# Patient Record
Sex: Female | Born: 1995
Health system: Southern US, Community
[De-identification: ages and names within clinical notes are randomized; demographics above are authoritative.]

## PROBLEM LIST (undated history)

## (undated) DIAGNOSIS — K219 Gastro-esophageal reflux disease without esophagitis: Secondary | ICD-10-CM

## (undated) DIAGNOSIS — F32A Depression, unspecified: Secondary | ICD-10-CM

## (undated) DIAGNOSIS — E119 Type 2 diabetes mellitus without complications: Secondary | ICD-10-CM

## (undated) DIAGNOSIS — M1990 Unspecified osteoarthritis, unspecified site: Secondary | ICD-10-CM

## (undated) DIAGNOSIS — K7689 Other specified diseases of liver: Secondary | ICD-10-CM

## (undated) DIAGNOSIS — I10 Essential (primary) hypertension: Secondary | ICD-10-CM

## (undated) DIAGNOSIS — K2970 Gastritis, unspecified, without bleeding: Secondary | ICD-10-CM

## (undated) DIAGNOSIS — Q6689 Other  specified congenital deformities of feet: Secondary | ICD-10-CM

## (undated) DIAGNOSIS — F419 Anxiety disorder, unspecified: Secondary | ICD-10-CM

## (undated) DIAGNOSIS — E785 Hyperlipidemia, unspecified: Secondary | ICD-10-CM

## (undated) DIAGNOSIS — K648 Other hemorrhoids: Secondary | ICD-10-CM

## (undated) DIAGNOSIS — D649 Anemia, unspecified: Secondary | ICD-10-CM

## (undated) HISTORY — DX: Gastritis, unspecified, without bleeding: K29.70

## (undated) HISTORY — PX: KNEE SURGERY: SHX244

## (undated) HISTORY — DX: Unspecified osteoarthritis, unspecified site: M19.90

## (undated) HISTORY — DX: Anemia, unspecified: D64.9

## (undated) HISTORY — DX: Type 2 diabetes mellitus without complications: E11.9

## (undated) HISTORY — DX: Other hemorrhoids: K64.8

## (undated) HISTORY — DX: Hyperlipidemia, unspecified: E78.5

## (undated) HISTORY — DX: Essential (primary) hypertension: I10

## (undated) HISTORY — PX: UPPER GASTROINTESTINAL ENDOSCOPY: SHX188

## (undated) HISTORY — PX: COLONOSCOPY: SHX174

## (undated) HISTORY — DX: Anxiety disorder, unspecified: F41.9

## (undated) HISTORY — DX: Other specified congenital deformities of feet: Q66.89

## (undated) HISTORY — PX: APPENDECTOMY: SHX54

## (undated) HISTORY — DX: Depression, unspecified: F32.A

## (undated) HISTORY — DX: Gastro-esophageal reflux disease without esophagitis: K21.9

## (undated) HISTORY — DX: Other specified diseases of liver: K76.89

---
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
## 2014-05-04 ENCOUNTER — Emergency Department (HOSPITAL_COMMUNITY)
Admission: EM | Admit: 2014-05-04 | Discharge: 2014-05-04 | Disposition: A | Discharge status: Home / Self Care | Payer: Medicaid Other | Attending: Emergency Medicine | Admitting: Emergency Medicine

## 2014-05-04 DIAGNOSIS — Z8776 Personal history of (corrected) congenital malformations of integument, limbs and musculoskeletal system: Secondary | ICD-10-CM | POA: Diagnosis not present

## 2014-05-04 DIAGNOSIS — R339 Retention of urine, unspecified: Secondary | ICD-10-CM | POA: Insufficient documentation

## 2014-05-04 DIAGNOSIS — J45909 Unspecified asthma, uncomplicated: Secondary | ICD-10-CM | POA: Insufficient documentation

## 2014-05-04 DIAGNOSIS — Z8639 Personal history of other endocrine, nutritional and metabolic disease: Secondary | ICD-10-CM | POA: Insufficient documentation

## 2014-05-04 DIAGNOSIS — Z87768 Personal history of other specified (corrected) congenital malformations of integument, limbs and musculoskeletal system: Secondary | ICD-10-CM | POA: Insufficient documentation

## 2014-05-04 DIAGNOSIS — Z862 Personal history of diseases of the blood and blood-forming organs and certain disorders involving the immune mechanism: Secondary | ICD-10-CM | POA: Insufficient documentation

## 2014-05-04 DIAGNOSIS — Z79899 Other long term (current) drug therapy: Secondary | ICD-10-CM | POA: Diagnosis not present

## 2014-05-04 DIAGNOSIS — N390 Urinary tract infection, site not specified: Secondary | ICD-10-CM | POA: Insufficient documentation

## 2014-05-04 LAB — URINE MICROSCOPIC-ADD ON

## 2014-05-04 LAB — URINALYSIS, ROUTINE W REFLEX MICROSCOPIC
Bilirubin Urine: NEGATIVE
GLUCOSE, UA: NEGATIVE mg/dL
Ketones, ur: NEGATIVE mg/dL
Nitrite: NEGATIVE
PH: 7 (ref 5.0–8.0)
PROTEIN: NEGATIVE mg/dL
Specific Gravity, Urine: 1.024 (ref 1.005–1.030)
Urobilinogen, UA: 0.2 mg/dL (ref 0.0–1.0)

## 2014-05-04 MED ORDER — PHENAZOPYRIDINE HCL 200 MG PO TABS: 200.0000 mg | ORAL_TABLET | Freq: Three times a day (TID) | ORAL | Status: DC | PRN

## 2014-05-04 MED ORDER — CEPHALEXIN 500 MG PO CAPS: 500.0000 mg | ORAL_CAPSULE | Freq: Four times a day (QID) | ORAL | Status: DC

## 2014-05-04 NOTE — ED Notes (Signed)
Bed: [facility] Expected date:  Expected time:  Means of arrival:  Comments: 

## 2014-05-04 NOTE — ED Notes (Signed)
Did bladder scan on patient she has 86 ml in her bladder upon scan

## 2014-05-04 NOTE — ED Notes (Signed)
Pt reports dysuria x1 week. Pain 5/10. Pt reports hx of bladder problems and leakage. Pt did not take the medicine last week. Pt started having urinary retention this morning.

## 2014-05-04 NOTE — ED Notes (Signed)
Bed: [facility] Expected date:  Expected time:  Means of arrival:  Comments: 

## 2014-05-04 NOTE — Discharge Instructions (Signed)

## 2014-05-04 NOTE — ED Provider Notes (Signed)
CSN: [id number]     Arrival date & time 05/04/14  1303 History   First MD Initiated Contact with Patient 05/04/14 1427     Chief Complaint  Patient presents with  . Urinary Retention     (Consider location/radiation/quality/duration/timing/severity/associated sxs/prior Treatment) HPI   Meghan Riggs is a 18 y.o. female presents for evaluation dysuria and decreased urinary out put with urgency, for several days. She denies fever, chills, nausea, vomiting, weakness, or dizziness. She has chronic bladder disease, requiring medication, and follows closely with a urologist. She describes bladder instability. There are no other known modifying factors.  Past Medical History  Diagnosis Date  . Club foot 1996    Congenital  . Hyperlipidemia   . Asthma     Dr. Donneta Romberg   Past Surgical History  Procedure Laterality Date  . Club foot release  5260    @ 50 days old  . Knee surgery     Family History  Problem Relation Age of Onset  . Diabetes Father   . Diabetes Paternal Uncle   . Diabetes Paternal Grandmother    History  Substance Use Topics  . Smoking status: Never Smoker   . Smokeless tobacco: Not on file  . Alcohol Use: No   OB History   Grav Para Term Preterm Abortions TAB SAB Ect Mult Living                 Review of Systems  All other systems reviewed and are negative.     Allergies  Review of patient's allergies indicates no known allergies.  Home Medications   Prior to Admission medications   Medication Sig Start Date End Date Taking? Authorizing Provider  Albuterol Sulfate (VENTOLIN HFA IN) Inhale 2 puffs into the lungs every 6 (six) hours as needed.    Yes Historical Provider, MD  cetirizine (ZYRTEC) 10 MG tablet Take 10 mg by mouth daily.     Yes Historical Provider, MD  etonogestrel-ethinyl estradiol (NUVARING) 0.12-0.015 MG/24HR vaginal ring Place 1 each vaginally See admin instructions. Insert vaginally and leave in place for 24 days then puts a new  one in on the same day for 24 days and then takes out for four days.   Yes Historical Provider, MD  fesoterodine (TOVIAZ) 8 MG TB24 tablet Take 8 mg by mouth daily.   Yes Historical Provider, MD  omeprazole (PRILOSEC) 20 MG capsule Take 20 mg by mouth daily.   Yes Historical Provider, MD  cephALEXin (KEFLEX) 500 MG capsule Take 1 capsule (500 mg total) by mouth 4 (four) times daily. 05/04/14   Richarda Blade, MD  phenazopyridine (PYRIDIUM) 200 MG tablet Take 1 tablet (200 mg total) by mouth 3 (three) times daily as needed (dysuria). 05/04/14   Richarda Blade, MD   BP 122/72  Pulse 87  Temp(Src) 99 F (37.2 C) (Oral)  Resp 18  SpO2 100% Physical Exam  Nursing note and vitals reviewed. Constitutional: She is oriented to person, place, and time. She appears well-developed and well-nourished.  HENT:  Head: Normocephalic and atraumatic.  Right Ear: External ear normal.  Left Ear: External ear normal.  Eyes: Conjunctivae and EOM are normal. Pupils are equal, round, and reactive to light.  Neck: Normal range of motion and phonation normal. Neck supple.  Cardiovascular: Normal rate, regular rhythm, normal heart sounds and intact distal pulses.   Pulmonary/Chest: Effort normal and breath sounds normal. She exhibits no bony tenderness.  Abdominal: Soft. There is no tenderness.  Mild suprapubic  Genitourinary:  No costovertebral angle tenderness  Musculoskeletal: Normal range of motion.  Neurological: She is alert and oriented to person, place, and time. No cranial nerve deficit or sensory deficit. She exhibits normal muscle tone. Coordination normal.  Skin: Skin is warm, dry and intact.  Psychiatric: She has a normal mood and affect. Her behavior is normal. Judgment and thought content normal.    ED Course  Procedures (including critical care time) Labs Review Labs Reviewed  URINALYSIS, ROUTINE W REFLEX MICROSCOPIC - Abnormal; Notable for the following:    APPearance CLOUDY (*)    Hgb  urine dipstick SMALL (*)    Leukocytes, UA LARGE (*)    All other components within normal limits  URINE MICROSCOPIC-ADD ON - Abnormal; Notable for the following:    Bacteria, UA FEW (*)    All other components within normal limits  URINE CULTURE    Imaging Review No results found.   EKG Interpretation None      MDM   Final diagnoses:  UTI (lower urinary tract infection)   Evaluation consistent with urinary tract infection, cystitis. No urinary retention. Doubt, sepsis, serious bacterial infection or metabolic instability. She takes an anticholinergic for urge incontinence. UC pending  Nursing Notes Reviewed/ Care Coordinated Applicable Imaging Reviewed Interpretation of Laboratory Data incorporated into ED treatment  The patient appears reasonably screened and/or stabilized for discharge and I doubt any other medical condition or other [facility] requiring further screening, evaluation, or treatment in the ED at this time prior to discharge.  Plan: Home Medications- Keflex, Pyridium; Home Treatments- rest, fluids; return here if the recommended treatment, does not improve the symptoms; Recommended follow up- Urology 1 week   Richarda Blade, MD 05/05/14 1220

## 2014-05-06 LAB — URINE CULTURE: Colony Count: >=100000

## 2014-05-09 ENCOUNTER — Telehealth (HOSPITAL_BASED_OUTPATIENT_CLINIC_OR_DEPARTMENT_OTHER): Payer: Self-pay | Admitting: Emergency Medicine

## 2014-05-09 NOTE — Telephone Encounter (Signed)
Post ED Visit - Positive Culture Follow-up  Culture report reviewed by antimicrobial stewardship pharmacist: []  Wes Isabella, Pharm.D., BCPS [x]  Heide Guile, Pharm.D., BCPS []  Alycia Rossetti, Pharm.D., BCPS []  [place], Pharm.D., BCPS, AAHIVP []  Legrand Como, Pharm.D., BCPS, AAHIVP  Positive urine culture Treated with Keflex, organism sensitive to the same and no further patient follow-up is required at this time.  Hustler, Rex Kras 05/09/2014, 11:08 AM

## 2014-05-28 ENCOUNTER — Emergency Department (HOSPITAL_COMMUNITY)
Admission: EM | Admit: 2014-05-28 | Discharge: 2014-05-29 | Disposition: A | Discharge status: Home / Self Care | Payer: Medicaid Other | Attending: Emergency Medicine | Admitting: Emergency Medicine

## 2014-05-28 ENCOUNTER — Encounter (HOSPITAL_COMMUNITY): Payer: Self-pay | Admitting: Emergency Medicine

## 2014-05-28 DIAGNOSIS — N760 Acute vaginitis: Secondary | ICD-10-CM | POA: Diagnosis not present

## 2014-05-28 DIAGNOSIS — M549 Dorsalgia, unspecified: Secondary | ICD-10-CM | POA: Insufficient documentation

## 2014-05-28 DIAGNOSIS — Z862 Personal history of diseases of the blood and blood-forming organs and certain disorders involving the immune mechanism: Secondary | ICD-10-CM | POA: Diagnosis not present

## 2014-05-28 DIAGNOSIS — B9689 Other specified bacterial agents as the cause of diseases classified elsewhere: Secondary | ICD-10-CM | POA: Diagnosis not present

## 2014-05-28 DIAGNOSIS — R1011 Right upper quadrant pain: Secondary | ICD-10-CM | POA: Diagnosis not present

## 2014-05-28 DIAGNOSIS — Z79899 Other long term (current) drug therapy: Secondary | ICD-10-CM | POA: Diagnosis not present

## 2014-05-28 DIAGNOSIS — R197 Diarrhea, unspecified: Secondary | ICD-10-CM | POA: Diagnosis not present

## 2014-05-28 DIAGNOSIS — R112 Nausea with vomiting, unspecified: Secondary | ICD-10-CM | POA: Diagnosis not present

## 2014-05-28 DIAGNOSIS — Q6600 Congenital talipes equinovarus, unspecified foot: Secondary | ICD-10-CM | POA: Diagnosis not present

## 2014-05-28 DIAGNOSIS — Z3202 Encounter for pregnancy test, result negative: Secondary | ICD-10-CM | POA: Diagnosis not present

## 2014-05-28 DIAGNOSIS — R1084 Generalized abdominal pain: Secondary | ICD-10-CM | POA: Diagnosis not present

## 2014-05-28 DIAGNOSIS — J45909 Unspecified asthma, uncomplicated: Secondary | ICD-10-CM | POA: Diagnosis not present

## 2014-05-28 DIAGNOSIS — R1012 Left upper quadrant pain: Secondary | ICD-10-CM | POA: Insufficient documentation

## 2014-05-28 DIAGNOSIS — Z8639 Personal history of other endocrine, nutritional and metabolic disease: Secondary | ICD-10-CM | POA: Insufficient documentation

## 2014-05-28 DIAGNOSIS — K921 Melena: Secondary | ICD-10-CM | POA: Insufficient documentation

## 2014-05-28 DIAGNOSIS — A499 Bacterial infection, unspecified: Secondary | ICD-10-CM | POA: Insufficient documentation

## 2014-05-28 DIAGNOSIS — N390 Urinary tract infection, site not specified: Secondary | ICD-10-CM | POA: Diagnosis not present

## 2014-05-28 LAB — COMPREHENSIVE METABOLIC PANEL
ALBUMIN: 3.8 g/dL (ref 3.5–5.2)
ALK PHOS: 74 U/L (ref 39–117)
ALT: 30 U/L (ref 0–35)
AST: 31 U/L (ref 0–37)
Anion gap: 17 — ABNORMAL HIGH (ref 5–15)
BILIRUBIN TOTAL: 0.4 mg/dL (ref 0.3–1.2)
BUN: 7 mg/dL (ref 6–23)
CHLORIDE: 102 meq/L (ref 96–112)
CO2: 19 mEq/L (ref 19–32)
Calcium: 9.4 mg/dL (ref 8.4–10.5)
Creatinine, Ser: 0.56 mg/dL (ref 0.50–1.10)
GFR calc Af Amer: >90 mL/min (ref >90)
GFR calc non Af Amer: >90 mL/min (ref >90)
Glucose, Bld: 88 mg/dL (ref 70–99)
POTASSIUM: 3.8 meq/L (ref 3.7–5.3)
SODIUM: 138 meq/L (ref 137–147)
Total Protein: 7.4 g/dL (ref 6.0–8.3)

## 2014-05-28 LAB — CBC WITH DIFFERENTIAL/PLATELET
BASOS ABS: 0 10*3/uL (ref 0.0–0.1)
BASOS PCT: 0 % (ref 0–1)
Eosinophils Absolute: 0 10*3/uL (ref 0.0–0.7)
Eosinophils Relative: 0 % (ref 0–5)
HCT: 39.3 % (ref 36.0–46.0)
HEMOGLOBIN: 13.8 g/dL (ref 12.0–15.0)
Lymphocytes Relative: 19 % (ref 12–46)
Lymphs Abs: 1.3 10*3/uL (ref 0.7–4.0)
MCH: 29.6 pg (ref 26.0–34.0)
MCHC: 35.1 g/dL (ref 30.0–36.0)
MCV: 84.2 fL (ref 78.0–100.0)
Monocytes Absolute: 0.3 10*3/uL (ref 0.1–1.0)
Monocytes Relative: 5 % (ref 3–12)
NEUTROS ABS: 5.1 10*3/uL (ref 1.7–7.7)
NEUTROS PCT: 76 % (ref 43–77)
PLATELETS: 247 10*3/uL (ref 150–400)
RBC: 4.67 MIL/uL (ref 3.87–5.11)
RDW: 12.2 % (ref 11.5–15.5)
WBC: 6.7 10*3/uL (ref 4.0–10.5)

## 2014-05-28 LAB — LIPASE, BLOOD: Lipase: 18 U/L (ref 11–59)

## 2014-05-28 MED ORDER — ONDANSETRON HCL 4 MG PO TABS
8.0000 mg | ORAL_TABLET | Freq: Once | ORAL | Status: AC
  Administered 2014-05-28: 8 mg via ORAL
  Filled 2014-05-28: qty 2

## 2014-05-28 NOTE — ED Notes (Signed)
Upper abd. Pain., diarrhea since yesterday.

## 2014-05-28 NOTE — ED Notes (Signed)
Patient states unable to provide urine sample at this time.  Will continue to monitor.

## 2014-05-29 ENCOUNTER — Encounter (HOSPITAL_COMMUNITY): Payer: Self-pay | Admitting: *Deleted

## 2014-05-29 ENCOUNTER — Emergency Department (HOSPITAL_COMMUNITY): Payer: Medicaid Other

## 2014-05-29 LAB — URINE MICROSCOPIC-ADD ON

## 2014-05-29 LAB — URINALYSIS, ROUTINE W REFLEX MICROSCOPIC
GLUCOSE, UA: NEGATIVE mg/dL
HGB URINE DIPSTICK: NEGATIVE
Ketones, ur: 15 mg/dL — AB
Nitrite: NEGATIVE
Protein, ur: 30 mg/dL — AB
SPECIFIC GRAVITY, URINE: 1.036 — AB (ref 1.005–1.030)
UROBILINOGEN UA: 0.2 mg/dL (ref 0.0–1.0)
pH: 6 (ref 5.0–8.0)

## 2014-05-29 LAB — WET PREP, GENITAL
Trich, Wet Prep: NONE SEEN
YEAST WET PREP: NONE SEEN

## 2014-05-29 LAB — POC URINE PREG, ED: Preg Test, Ur: NEGATIVE

## 2014-05-29 MED ORDER — PROMETHAZINE HCL 25 MG/ML IJ SOLN
25.0000 mg | Freq: Once | INTRAMUSCULAR | Status: AC
  Administered 2014-05-29: 25 mg via INTRAVENOUS
  Filled 2014-05-29: qty 1

## 2014-05-29 MED ORDER — CEFTRIAXONE SODIUM 250 MG IJ SOLR
250.0000 mg | Freq: Once | INTRAMUSCULAR | Status: AC
  Administered 2014-05-29: 250 mg via INTRAMUSCULAR
  Filled 2014-05-29: qty 250

## 2014-05-29 MED ORDER — IOHEXOL 300 MG/ML  SOLN
25.0000 mL | Freq: Once | INTRAMUSCULAR | Status: AC | PRN
  Administered 2014-05-29: 25 mL via ORAL

## 2014-05-29 MED ORDER — AZITHROMYCIN 250 MG PO TABS
1000.0000 mg | ORAL_TABLET | Freq: Once | ORAL | Status: AC
  Administered 2014-05-29: 1000 mg via ORAL
  Filled 2014-05-29: qty 4

## 2014-05-29 MED ORDER — OXYCODONE-ACETAMINOPHEN 5-325 MG PO TABS: 1.0000 | ORAL_TABLET | ORAL | Status: DC | PRN

## 2014-05-29 MED ORDER — METRONIDAZOLE 500 MG PO TABS: 500.0000 mg | ORAL_TABLET | Freq: Two times a day (BID) | ORAL | Status: DC

## 2014-05-29 MED ORDER — LIDOCAINE HCL (PF) 1 % IJ SOLN
0.9000 mL | Freq: Once | INTRAMUSCULAR | Status: AC
  Administered 2014-05-29: 0.9 mL

## 2014-05-29 MED ORDER — ONDANSETRON HCL 8 MG PO TABS: 8.0000 mg | ORAL_TABLET | Freq: Three times a day (TID) | ORAL | Status: DC | PRN

## 2014-05-29 MED ORDER — MORPHINE SULFATE 4 MG/ML IJ SOLN
4.0000 mg | Freq: Once | INTRAMUSCULAR | Status: AC
  Administered 2014-05-29: 4 mg via INTRAVENOUS
  Filled 2014-05-29: qty 1

## 2014-05-29 MED ORDER — IOHEXOL 300 MG/ML  SOLN
100.0000 mL | Freq: Once | INTRAMUSCULAR | Status: AC | PRN
  Administered 2014-05-29: 100 mL via INTRAVENOUS

## 2014-05-29 MED ORDER — SODIUM CHLORIDE 0.9 % IV BOLUS (SEPSIS)
1000.0000 mL | Freq: Once | INTRAVENOUS | Status: AC
Start: 2014-05-29 — End: 2014-05-29
  Administered 2014-05-29: 1000 mL via INTRAVENOUS

## 2014-05-29 MED ORDER — SULFAMETHOXAZOLE-TMP DS 800-160 MG PO TABS: 1.0000 | ORAL_TABLET | Freq: Two times a day (BID) | ORAL | Status: DC

## 2014-05-29 NOTE — ED Provider Notes (Signed)
  Face-to-face evaluation   History: She complains of abdominal pain, and diarrhea for 2 days.  Physical exam: Alert, cooperative. Abdomen with mild, right-sided tenderness  CT imaging reviewed, is consistent with enteritis. No evident abnormality of gallbladder or appendix.  I discussed the patient, and family members. All questions answered  Medical screening examination/treatment/procedure(s) were conducted as a shared visit with non-physician practitioner(s) and myself.  I personally evaluated the patient during the encounter  Richarda Blade, MD 05/29/14 [phone]

## 2014-05-29 NOTE — Discharge Instructions (Signed)
Abdominal Pain Many things can cause abdominal pain. Usually, abdominal pain is not caused by a disease and will improve without treatment. It can often be observed and treated at home. Your health care provider will do a physical exam and possibly order blood tests and X-rays to help determine the seriousness of your pain. However, in many cases, more time must pass before a clear cause of the pain can be found. Before that point, your health care provider may not know if you need more testing or further treatment. HOME CARE INSTRUCTIONS  Monitor your abdominal pain for any changes. The following actions may help to alleviate any discomfort you are experiencing:  Only take over-the-counter or prescription medicines as directed by your health care provider.  Do not take laxatives unless directed to do so by your health care provider.  Try a clear liquid diet (broth, tea, or water) as directed by your health care provider. Slowly move to a bland diet as tolerated. SEEK MEDICAL CARE IF:  You have unexplained abdominal pain.  You have abdominal pain associated with nausea or diarrhea.  You have pain when you urinate or have a bowel movement.  You experience abdominal pain that wakes you in the night.  You have abdominal pain that is worsened or improved by eating food.  You have abdominal pain that is worsened with eating fatty foods.  You have a fever. SEEK IMMEDIATE MEDICAL CARE IF:   Your pain does not go away within 2 hours.  You keep throwing up (vomiting).  Your pain is felt only in portions of the abdomen, such as the right side or the left lower portion of the abdomen.  You pass bloody or black tarry stools. MAKE SURE YOU:  Understand these instructions.   Will watch your condition.   Will get help right away if you are not doing well or get worse.  Document Released: 07/06/2005 Document Revised: 10/01/2013 Document Reviewed: 06/05/2013 [facility] Patient Information  2015 [place], [place]. This information is not intended to replace advice given to you by your health care provider. Make sure you discuss any questions you have with your health care provider.  Bacterial Vaginosis Bacterial vaginosis is a vaginal infection that occurs when the normal balance of bacteria in the vagina is disrupted. It results from an overgrowth of certain bacteria. This is the most common vaginal infection in women of childbearing age. Treatment is important to prevent complications, especially in pregnant women, as it can cause a premature delivery. CAUSES  Bacterial vaginosis is caused by an increase in harmful bacteria that are normally present in smaller amounts in the vagina. Several different kinds of bacteria can cause bacterial vaginosis. However, the reason that the condition develops is not fully understood. RISK FACTORS Certain activities or behaviors can put you at an increased risk of developing bacterial vaginosis, including:  Having a new sex partner or multiple sex partners.  Douching.  Using an intrauterine device (IUD) for contraception. Women do not get bacterial vaginosis from toilet seats, bedding, swimming pools, or contact with objects around them. SIGNS AND SYMPTOMS  Some women with bacterial vaginosis have no signs or symptoms. Common symptoms include:  Grey vaginal discharge.  A fishlike odor with discharge, especially after sexual intercourse.  Itching or burning of the vagina and vulva.  Burning or pain with urination. DIAGNOSIS  Your health care provider will take a medical history and examine the vagina for signs of bacterial vaginosis. A sample of vaginal fluid may be  taken. Your health care provider will look at this sample under a microscope to check for bacteria and abnormal cells. A vaginal pH test may also be done.  TREATMENT  Bacterial vaginosis may be treated with antibiotic medicines. These may be given in the form of a pill or a  vaginal cream. A second round of antibiotics may be prescribed if the condition comes back after treatment.  HOME CARE INSTRUCTIONS   Only take over-the-counter or prescription medicines as directed by your health care provider.  If antibiotic medicine was prescribed, take it as directed. Make sure you finish it even if you start to feel better.  Do not have sex until treatment is completed.  Tell all sexual partners that you have a vaginal infection. They should see their health care provider and be treated if they have problems, such as a mild rash or itching.  Practice safe sex by using condoms and only having one sex partner. SEEK MEDICAL CARE IF:   Your symptoms are not improving after 3 days of treatment.  You have increased discharge or pain.  You have a fever. MAKE SURE YOU:   Understand these instructions.  Will watch your condition.  Will get help right away if you are not doing well or get worse. FOR MORE INFORMATION  Centers for Disease Control and Prevention, Division of STD Prevention: [employer] American Sexual Health Association (ASHA): www.ashastd.org  Document Released: 09/26/2005 Document Revised: 07/17/2013 Document Reviewed: 05/08/2013 [facility] Patient Information 2015 [place], [place]. This information is not intended to replace advice given to you by your health care provider. Make sure you discuss any questions you have with your health care provider.  Urinary Tract Infection Urinary tract infections (UTIs) can develop anywhere along your urinary tract. Your urinary tract is your body's drainage system for removing wastes and extra water. Your urinary tract includes two kidneys, two ureters, a bladder, and a urethra. Your kidneys are a pair of bean-shaped organs. Each kidney is about the size of your fist. They are located below your ribs, one on each side of your spine. CAUSES Infections are caused by microbes, which are microscopic organisms, including  fungi, viruses, and bacteria. These organisms are so small that they can only be seen through a microscope. Bacteria are the microbes that most commonly cause UTIs. SYMPTOMS  Symptoms of UTIs may vary by age and gender of the patient and by the location of the infection. Symptoms in young women typically include a frequent and intense urge to urinate and a painful, burning feeling in the bladder or urethra during urination. Older women and men are more likely to be tired, shaky, and weak and have muscle aches and abdominal pain. A fever may mean the infection is in your kidneys. Other symptoms of a kidney infection include pain in your back or sides below the ribs, nausea, and vomiting. DIAGNOSIS To diagnose a UTI, your caregiver will ask you about your symptoms. Your caregiver also will ask to provide a urine sample. The urine sample will be tested for bacteria and white blood cells. White blood cells are made by your body to help fight infection. TREATMENT  Typically, UTIs can be treated with medication. Because most UTIs are caused by a bacterial infection, they usually can be treated with the use of antibiotics. The choice of antibiotic and length of treatment depend on your symptoms and the type of bacteria causing your infection. HOME CARE INSTRUCTIONS  If you were prescribed antibiotics, take them exactly as  your caregiver instructs you. Finish the medication even if you feel better after you have only taken some of the medication.  Drink enough water and fluids to keep your urine clear or pale yellow.  Avoid caffeine, tea, and carbonated beverages. They tend to irritate your bladder.  Empty your bladder often. Avoid holding urine for long periods of time.  Empty your bladder before and after sexual intercourse.  After a bowel movement, women should cleanse from front to back. Use each tissue only once. SEEK MEDICAL CARE IF:   You have back pain.  You develop a fever.  Your symptoms  do not begin to resolve within 3 days. SEEK IMMEDIATE MEDICAL CARE IF:   You have severe back pain or lower abdominal pain.  You develop chills.  You have nausea or vomiting.  You have continued burning or discomfort with urination. MAKE SURE YOU:   Understand these instructions.  Will watch your condition.  Will get help right away if you are not doing well or get worse. Document Released: 07/06/2005 Document Revised: 03/27/2012 Document Reviewed: 11/04/2011 [facility] Patient Information 2015 [place], [place]. This information is not intended to replace advice given to you by your health care provider. Make sure you discuss any questions you have with your health care provider.

## 2014-05-29 NOTE — ED Provider Notes (Signed)
CSN: [id number]     Arrival date & time 05/28/14  1920 History   First MD Initiated Contact with Patient 05/28/14 2338     Chief Complaint  Patient presents with  . Abdominal Pain  . Diarrhea     (Consider location/radiation/quality/duration/timing/severity/associated sxs/prior Treatment) HPI Comments: Meghan Riggs is a 18 y.o. Female with a PMHx of asthma and HLD, who presents to the ED today with complaints of 1 day of sudden onset stabbing upper abd pain across entire abdomen and down to umbilicus, 5/95, sharp, radiating into her lower back, constant, worse with drinking and having BMs, and unrelieved by pepto bismol. No known alleviating factors. Pt states that she ate spaghetti last night around 4pm, and around 9pm developed nausea and vomited her food, and then developed some upper abdominal pain. Associated symptoms include chills, diarrhea (nonbloody, nonbilious, loose and slightly watery, 2-3 episodes), black stool x1 episode after pepto bismol use, N/V which was nonbloody nonbilious and only stomach contents, approx 3 episodes, and decreased urination secondary to dehydration after not being able to tolerate PO intake all day today. Denies CP, SOB, belching, obstipation, constipation, cough, dysuria, hematuria, vaginal discharge, vaginal bleeding, hematochezia, hematemesis, flank pain, pelvic pain, recent sexual activity, myalgias or arthralgias. Denies NSAID use or alcohol use. No sick contacts or suspicious food intake. No recent travel. LMP 05/22/14. Has nuvaring for protection, is not currently sexually active but has had intercourse in the past. No recent antibiotic use this month.  Patient is a 18 y.o. female presenting with abdominal pain. The history is provided by the patient. No language interpreter was used.  Abdominal Pain Pain location:  LUQ and RUQ Pain quality: stabbing   Pain radiates to:  Back (lower back) Pain severity:  Moderate (9/10) Onset quality:   Sudden Duration:  1 day Timing:  Constant Progression:  Unchanged Chronicity:  New Context: eating (ate spaghetti approx 3 hrs prior to onset)   Context: not alcohol use, not diet changes, not previous surgeries, not recent illness, not recent sexual activity, not recent travel, not sick contacts and not suspicious food intake   Relieved by:  Nothing Worsened by:  Bowel movements and eating Ineffective treatments:  OTC medications (pepto bismol) Associated symptoms: chills, diarrhea, melena (one episode of black stool after pepto bismol the day previous), nausea and vomiting (nonbloody nonbilious, stomach contents, approx 3 episodes)   Associated symptoms: no anorexia, no belching, no chest pain, no constipation, no cough, no dysuria, no fever, no flatus, no hematemesis, no hematochezia, no hematuria, no shortness of breath, no sore throat, no vaginal bleeding and no vaginal discharge   Risk factors: obesity   Risk factors: no alcohol abuse, has not had multiple surgeries and not pregnant     Past Medical History  Diagnosis Date  . Club foot 1996    Congenital  . Hyperlipidemia   . Asthma     Dr. Donneta Romberg   Past Surgical History  Procedure Laterality Date  . Club foot release  3373    @ 60 days old  . Knee surgery     Family History  Problem Relation Age of Onset  . Diabetes Father   . Diabetes Paternal Uncle   . Diabetes Paternal Grandmother    History  Substance Use Topics  . Smoking status: Never Smoker   . Smokeless tobacco: Not on file  . Alcohol Use: No   OB History   Grav Para Term Preterm Abortions TAB SAB Ect Mult Living  Review of Systems  Constitutional: Positive for chills. Negative for fever.  HENT: Negative for congestion and sore throat.   Respiratory: Negative for cough and shortness of breath.   Cardiovascular: Negative for chest pain.  Gastrointestinal: Positive for nausea, vomiting (nonbloody nonbilious, stomach contents, approx 3  episodes), abdominal pain, diarrhea and melena (one episode of black stool after pepto bismol the day previous). Negative for constipation, blood in stool ("black stool" after pepto bismol which was not tarry), hematochezia, abdominal distention, anorexia, flatus and hematemesis.  Endocrine: Negative for polydipsia and polyuria.  Genitourinary: Positive for decreased urine volume. Negative for dysuria, urgency, frequency, hematuria, flank pain, vaginal bleeding, vaginal discharge, difficulty urinating, vaginal pain, menstrual problem and pelvic pain.  Musculoskeletal: Positive for back pain (lower). Negative for arthralgias, joint swelling, myalgias, neck pain and neck stiffness.  Skin: Negative for color change.  Neurological: Negative for dizziness, syncope, weakness, light-headedness, numbness and headaches.  Psychiatric/Behavioral: Negative for confusion.  10 Systems reviewed and are negative for acute change except as noted in the HPI.     Allergies  Review of patient's allergies indicates no known allergies.  Home Medications   Prior to Admission medications   Medication Sig Start Date End Date Taking? Authorizing Provider  albuterol (PROVENTIL HFA;VENTOLIN HFA) 108 (90 BASE) MCG/ACT inhaler Inhale 2 puffs into the lungs every 6 (six) hours as needed for wheezing or shortness of breath.   Yes Historical Provider, MD  cetirizine (ZYRTEC) 10 MG tablet Take 10 mg by mouth daily.     Yes Historical Provider, MD  etonogestrel-ethinyl estradiol (NUVARING) 0.12-0.015 MG/24HR vaginal ring Place 1 each vaginally See admin instructions. Insert vaginally and leave in place for 24 days then puts a new one in on the same day for 24 days and then takes out for four days.   Yes Historical Provider, MD  fesoterodine (TOVIAZ) 8 MG TB24 tablet Take 8 mg by mouth daily.   Yes Historical Provider, MD  omeprazole (PRILOSEC) 20 MG capsule Take 20 mg by mouth daily.   Yes Historical Provider, MD   phenazopyridine (PYRIDIUM) 200 MG tablet Take 200 mg by mouth 3 (three) times daily as needed for pain.   Yes Historical Provider, MD  metroNIDAZOLE (FLAGYL) 500 MG tablet Take 1 tablet (500 mg total) by mouth 2 (two) times daily. One po bid x 7 days 05/29/14   Patty Sermons Camprubi-Soms, PA-C  ondansetron (ZOFRAN) 8 MG tablet Take 1 tablet (8 mg total) by mouth every 8 (eight) hours as needed for nausea or vomiting. 05/29/14   Patty Sermons Camprubi-Soms, PA-C  sulfamethoxazole-trimethoprim (BACTRIM DS) 800-160 MG per tablet Take 1 tablet by mouth 2 (two) times daily. 05/29/14   Jaida Basurto Strupp Camprubi-Soms, PA-C   BP 125/77  Pulse 89  Temp(Src) 98.3 F (36.8 C)  Resp 18  Ht 5' (1.524 m)  Wt 198 lb (89.812 kg)  BMI 38.67 kg/m2  SpO2 99%  LMP 05/28/2014 Physical Exam  Nursing note and vitals reviewed. Constitutional: She is oriented to person, place, and time. Vital signs are normal. She appears well-developed and well-nourished.  Non-toxic appearance. No distress.  VSS, afebrile, nontoxic, NAD  HENT:  Head: Normocephalic and atraumatic.  Mouth/Throat: Oropharynx is clear and moist. Mucous membranes are dry.  Dry mucous membranes  Eyes: Conjunctivae and EOM are normal. Pupils are equal, round, and reactive to light. Right eye exhibits no discharge. Left eye exhibits no discharge.  Neck: Normal range of motion. Neck supple.  Cardiovascular: Normal rate, regular rhythm,  normal heart sounds and intact distal pulses.  Exam reveals no gallop and no friction rub.   No murmur heard. Pulmonary/Chest: Effort normal and breath sounds normal. No respiratory distress. She has no decreased breath sounds. She has no wheezes. She has no rhonchi. She has no rales.  Abdominal: Soft. Normal appearance and bowel sounds are normal. She exhibits no distension. There is tenderness in the right upper quadrant, right lower quadrant and periumbilical area. There is positive Murphy's sign. There is no  rigidity, no rebound, no guarding, no CVA tenderness and no tenderness at McBurney's point.  Obese abdomen, body habitus limits exam. +BS throughout, soft, nondistended, with TTP in RUQ, periumbilical, and RLQ. Most intense tenderness in RUQ. +Murphy's sign, neg mcburney's point TTP, neg psoas sign with +Rovsing's sign. No peritoneal signs. No CVA TTP.   Genitourinary: Uterus normal. Pelvic exam was performed with patient supine. There is no rash, tenderness or lesion on the right labia. There is no rash, tenderness or lesion on the left labia. Cervix exhibits discharge and friability. Cervix exhibits no motion tenderness. Right adnexum displays no mass, no tenderness and no fullness. Left adnexum displays no mass, no tenderness and no fullness. No erythema, tenderness or bleeding around the vagina. No signs of injury around the vagina. Vaginal discharge found.  No rashes, lesions, or tenderness to external genitalia. No erythema, injury, or tenderness to vaginal mucosa. +mucoid vaginal discharge in vaginal vault. No bleeding within vaginal vault. No adnexal masses, tenderness, or fullness. No CMT. Cervix with strawberry appearance and punctate erythema, friability, and mucoid discharge from cervical os. Nuvaring in place. Uterus non-deviated, mobile, nonTTP, and without enlargement.    Musculoskeletal: Normal range of motion.  Neurological: She is alert and oriented to person, place, and time.  Skin: Skin is warm, dry and intact. No rash noted.  Psychiatric: She has a normal mood and affect.    ED Course  Procedures (including critical care time) Labs Review Labs Reviewed  WET PREP, GENITAL - Abnormal; Notable for the following:    Clue Cells Wet Prep HPF POC FEW (*)    WBC, Wet Prep HPF POC FEW (*)    All other components within normal limits  COMPREHENSIVE METABOLIC PANEL - Abnormal; Notable for the following:    Anion gap 17 (*)    All other components within normal limits  URINALYSIS,  ROUTINE W REFLEX MICROSCOPIC - Abnormal; Notable for the following:    Color, Urine AMBER (*)    APPearance CLOUDY (*)    Specific Gravity, Urine 1.036 (*)    Bilirubin Urine SMALL (*)    Ketones, ur 15 (*)    Protein, ur 30 (*)    Leukocytes, UA MODERATE (*)    All other components within normal limits  URINE MICROSCOPIC-ADD ON - Abnormal; Notable for the following:    Squamous Epithelial / LPF FEW (*)    Bacteria, UA MANY (*)    All other components within normal limits  GC/CHLAMYDIA PROBE AMP  CBC WITH DIFFERENTIAL  LIPASE, BLOOD  POC URINE PREG, ED    Imaging Review No results found.   EKG Interpretation None      MDM   Final diagnoses:  BV (bacterial vaginosis)  UTI (lower urinary tract infection)    18y/o female with upper abd pain, +murphy's but also has +RLQ pain and +rovsing's. Will obtain basic labs, u/a, upreg, pelvic, GC/CT, wet prep, and obtain CT abd/pelvis to r/o GB disease and appy, as well as pelvic  infection/TOA/cysts. Will give morphine 4mg  and phenergan now, pt already got zofran 8mg  ODT and states it made her nauseated. Will start IVFs as well as pt appears mildly dehydrated. Will reassess after pelvic and CT.  1:25 AM Pelvic demonstrating mucoid discharge and cervical friability with strawberry appearance. Will tx for GC/CT empirically. Wet prep reveals clue cells, will tx for BV using metro. U/A reveals +leuks, 7-10WBC, many bacteria, therefore will tx for UTI with bactrim. Upreg neg. CBC w/diff unremarkable, therefore less concerning for acute appy but risk still exists. CMP WNL, which argues against acute cholecystitis. Will reassess after CT. Discussed case with Maylene Roes PA-C who will f/up with CT results and dispo patient accordingly. Rx's written for metronidazole, zofran tabs, and bactrim.   BP 119/74  Pulse 78  Temp(Src) 98.3 F (36.8 C)  Resp 18  Ht 5' (1.524 m)  Wt 198 lb (89.812 kg)  BMI 38.67 kg/m2  SpO2 99%  LMP  05/28/2014  Meds ordered this encounter  Medications  . ondansetron (ZOFRAN) tablet 8 mg    Sig:   . sodium chloride 0.9 % bolus 1,000 mL    Sig:   . promethazine (PHENERGAN) injection 25 mg    Sig:   . morphine 4 MG/ML injection 4 mg    Sig:   . iohexol (OMNIPAQUE) 300 MG/ML solution 25 mL    Sig:   . iohexol (OMNIPAQUE) 300 MG/ML solution 100 mL    Sig:   . azithromycin (ZITHROMAX) tablet 1,000 mg    Sig:   . cefTRIAXone (ROCEPHIN) injection 250 mg    Sig:     Order Specific Question:  Antibiotic Indication:    Answer:  STD  . metroNIDAZOLE (FLAGYL) 500 MG tablet    Sig: Take 1 tablet (500 mg total) by mouth 2 (two) times daily. One po bid x 7 days    Dispense:  14 tablet    Refill:  0    Order Specific Question:  Supervising Provider    Answer:  Noemi Chapel D [[id number]]  . sulfamethoxazole-trimethoprim (BACTRIM DS) 800-160 MG per tablet    Sig: Take 1 tablet by mouth 2 (two) times daily.    Dispense:  14 tablet    Refill:  0    Order Specific Question:  Supervising Provider    Answer:  Noemi Chapel D [[id number]]  . ondansetron (ZOFRAN) 8 MG tablet    Sig: Take 1 tablet (8 mg total) by mouth every 8 (eight) hours as needed for nausea or vomiting.    Dispense:  10 tablet    Refill:  0    Order Specific Question:  Supervising Provider    Answer:  Johnna Acosta [[id number]]       [place], PA-C 05/29/14 [phone]

## 2014-05-29 NOTE — ED Notes (Signed)
Patient transported to CT 

## 2014-05-29 NOTE — ED Notes (Signed)
NAD at this time.  

## 2014-05-29 NOTE — ED Notes (Signed)
PA and CT notified that pt is unable to tolerate oral contrast.

## 2014-05-29 NOTE — ED Notes (Signed)
MD at bedside. 

## 2014-05-30 LAB — GC/CHLAMYDIA PROBE AMP
CT Probe RNA: NEGATIVE
GC Probe RNA: NEGATIVE

## 2015-04-13 ENCOUNTER — Emergency Department (HOSPITAL_COMMUNITY)
Admission: EM | Admit: 2015-04-13 | Discharge: 2015-04-13 | Disposition: A | Discharge status: Home / Self Care | Payer: Medicaid Other | Attending: Emergency Medicine | Admitting: Emergency Medicine

## 2015-04-13 ENCOUNTER — Encounter (HOSPITAL_COMMUNITY): Payer: Self-pay | Admitting: Emergency Medicine

## 2015-04-13 DIAGNOSIS — L989 Disorder of the skin and subcutaneous tissue, unspecified: Secondary | ICD-10-CM | POA: Diagnosis present

## 2015-04-13 DIAGNOSIS — Z79899 Other long term (current) drug therapy: Secondary | ICD-10-CM | POA: Diagnosis not present

## 2015-04-13 DIAGNOSIS — Z8639 Personal history of other endocrine, nutritional and metabolic disease: Secondary | ICD-10-CM | POA: Diagnosis not present

## 2015-04-13 DIAGNOSIS — Z8776 Personal history of (corrected) congenital malformations of integument, limbs and musculoskeletal system: Secondary | ICD-10-CM | POA: Insufficient documentation

## 2015-04-13 DIAGNOSIS — L02211 Cutaneous abscess of abdominal wall: Secondary | ICD-10-CM | POA: Diagnosis not present

## 2015-04-13 DIAGNOSIS — J45909 Unspecified asthma, uncomplicated: Secondary | ICD-10-CM | POA: Diagnosis not present

## 2015-04-13 DIAGNOSIS — L02219 Cutaneous abscess of trunk, unspecified: Secondary | ICD-10-CM

## 2015-04-13 MED ORDER — SULFAMETHOXAZOLE-TRIMETHOPRIM 800-160 MG PO TABS
1.0000 | ORAL_TABLET | Freq: Once | ORAL | Status: AC
  Administered 2015-04-13: 1 via ORAL
  Filled 2015-04-13: qty 1

## 2015-04-13 MED ORDER — LIDOCAINE-EPINEPHRINE 1 %-1:100000 IJ SOLN
10.0000 mL | Freq: Once | INTRAMUSCULAR | Status: AC
  Administered 2015-04-13: 10 mL
  Filled 2015-04-13: qty 1

## 2015-04-13 MED ORDER — SULFAMETHOXAZOLE-TRIMETHOPRIM 800-160 MG PO TABS: 1.0000 | ORAL_TABLET | Freq: Once | ORAL | Status: DC

## 2015-04-13 MED ORDER — OXYCODONE-ACETAMINOPHEN 5-325 MG PO TABS
1.0000 | ORAL_TABLET | Freq: Once | ORAL | Status: AC
  Administered 2015-04-13: 1 via ORAL
  Filled 2015-04-13: qty 1

## 2015-04-13 NOTE — ED Notes (Signed)
Per pt, states bug bite on right flank since last Wednesday-has gotten bigger-popped it a couple of times-increased discomfort

## 2015-04-13 NOTE — Discharge Instructions (Signed)

## 2015-04-13 NOTE — ED Notes (Signed)
MD at bedside. EDP KOHUT PRESENT

## 2015-04-13 NOTE — ED Provider Notes (Signed)
CSN: [id number]     Arrival date & time 04/13/15  0904 History   First MD Initiated Contact with Patient 04/13/15 [phone]     Chief Complaint  Patient presents with  . Insect Bite     (Consider location/radiation/quality/duration/timing/severity/associated sxs/prior Treatment) HPI   19yF with painful lesions R flank. Onset Wednesday. Felt may be bug bite. Progressively worsening in terms of pain and size. Yesterday friend "mashed on it" and reports some purulent drainage. Symptoms have continued to worsen despite this. Subjective fever. Not diabetic. No n/v.   Past Medical History  Diagnosis Date  . Club foot 1996    Congenital  . Hyperlipidemia   . Asthma     Dr. Donneta Romberg   Past Surgical History  Procedure Laterality Date  . Club foot release  5068    @ 18 days old  . Knee surgery     Family History  Problem Relation Age of Onset  . Diabetes Father   . Diabetes Paternal Uncle   . Diabetes Paternal Grandmother    History  Substance Use Topics  . Smoking status: Never Smoker   . Smokeless tobacco: Not on file  . Alcohol Use: No   OB History    No data available     Review of Systems  All systems reviewed and negative, other than as noted in HPI.   Allergies  Review of patient's allergies indicates no known allergies.  Home Medications   Prior to Admission medications   Medication Sig Start Date End Date Taking? Authorizing Provider  albuterol (PROVENTIL HFA;VENTOLIN HFA) 108 (90 BASE) MCG/ACT inhaler Inhale 2 puffs into the lungs every 6 (six) hours as needed for wheezing or shortness of breath.    Historical Provider, MD  cetirizine (ZYRTEC) 10 MG tablet Take 10 mg by mouth daily.      Historical Provider, MD  etonogestrel-ethinyl estradiol (NUVARING) 0.12-0.015 MG/24HR vaginal ring Place 1 each vaginally See admin instructions. Insert vaginally and leave in place for 24 days then puts a new one in on the same day for 24 days and then takes out for four days.     Historical Provider, MD  fesoterodine (TOVIAZ) 8 MG TB24 tablet Take 8 mg by mouth daily.    Historical Provider, MD  metroNIDAZOLE (FLAGYL) 500 MG tablet Take 1 tablet (500 mg total) by mouth 2 (two) times daily. One po bid x 7 days 05/29/14   Mercedes Camprubi-Soms, PA-C  omeprazole (PRILOSEC) 20 MG capsule Take 20 mg by mouth daily.    Historical Provider, MD  ondansetron (ZOFRAN) 8 MG tablet Take 1 tablet (8 mg total) by mouth every 8 (eight) hours as needed for nausea or vomiting. 05/29/14   Mercedes Camprubi-Soms, PA-C  oxyCODONE-acetaminophen (PERCOCET) 5-325 MG per tablet Take 1 tablet by mouth every 4 (four) hours as needed for severe pain. 05/29/14   Daleen Bo, MD  phenazopyridine (PYRIDIUM) 200 MG tablet Take 200 mg by mouth 3 (three) times daily as needed for pain.    Historical Provider, MD  sulfamethoxazole-trimethoprim (BACTRIM DS) 800-160 MG per tablet Take 1 tablet by mouth 2 (two) times daily. 05/29/14   Mercedes Camprubi-Soms, PA-C   BP 112/80 mmHg  Pulse 70  Temp(Src) 98.3 F (36.8 C) (Oral)  Resp 18  Ht 5' (1.524 m)  Wt 187 lb (84.823 kg)  BMI 36.52 kg/m2  SpO2 100%  LMP 03/10/2015 Physical Exam  Constitutional: She appears well-developed and well-nourished. No distress.  HENT:  Head: Normocephalic and atraumatic.  Eyes: Conjunctivae are normal. Right eye exhibits no discharge. Left eye exhibits no discharge.  Neck: Neck supple.  Cardiovascular: Normal rate, regular rhythm and normal heart sounds.  Exam reveals no gallop and no friction rub.   No murmur heard. Pulmonary/Chest: Effort normal and breath sounds normal. No respiratory distress.  Abdominal: Soft. She exhibits no distension. There is no tenderness.  Musculoskeletal: She exhibits no edema or tenderness.  Neurological: She is alert.  Skin: Skin is warm and dry.     Abscess in pictured location. Some spontaneous purulent drainage. A few cm of surrounding cellulitis.   Psychiatric: She has a normal mood  and affect. Her behavior is normal. Thought content normal.  Nursing note and vitals reviewed.   ED Course  Procedures (including critical care time) INCISION AND DRAINAGE Performed by: Virgel Manifold Consent: Verbal consent obtained. Risks and benefits: risks, benefits and alternatives were discussed Type: abscess  Body area: R flank Anesthesia: local infiltration  Incision was made with a scalpel.  Local anesthetic: lidocaine 1% w epinephrine  Anesthetic total: 3 ml  Complexity: complex Blunt dissection to break up loculations  Drainage: purulent  Drainage amount: moderate  Packing material: none  Patient tolerance: Patient tolerated the procedure well with no immediate complications.     Labs Review Labs Reviewed - No data to display  Imaging Review No results found.   EKG Interpretation None      MDM   Final diagnoses:  Cutaneous abscess of trunk, unspecified site    19yf with abscess R flank. Mild surrounding cellulitis. I&D'd. Abx. Continued wound care and return precautions discussed.     Virgel Manifold, MD 04/13/15 1028

## 2015-10-26 ENCOUNTER — Encounter (HOSPITAL_COMMUNITY): Payer: Self-pay | Admitting: Emergency Medicine

## 2015-10-26 ENCOUNTER — Emergency Department (HOSPITAL_COMMUNITY): Payer: Medicaid Other

## 2015-10-26 ENCOUNTER — Emergency Department (HOSPITAL_COMMUNITY)
Admission: EM | Admit: 2015-10-26 | Discharge: 2015-10-26 | Disposition: A | Discharge status: Home / Self Care | Payer: Medicaid Other | Attending: Emergency Medicine | Admitting: Emergency Medicine

## 2015-10-26 DIAGNOSIS — M545 Low back pain, unspecified: Secondary | ICD-10-CM

## 2015-10-26 DIAGNOSIS — Q6689 Other  specified congenital deformities of feet: Secondary | ICD-10-CM | POA: Diagnosis not present

## 2015-10-26 DIAGNOSIS — Z3202 Encounter for pregnancy test, result negative: Secondary | ICD-10-CM | POA: Diagnosis not present

## 2015-10-26 DIAGNOSIS — J45909 Unspecified asthma, uncomplicated: Secondary | ICD-10-CM | POA: Insufficient documentation

## 2015-10-26 DIAGNOSIS — R110 Nausea: Secondary | ICD-10-CM | POA: Diagnosis not present

## 2015-10-26 DIAGNOSIS — R1011 Right upper quadrant pain: Secondary | ICD-10-CM | POA: Insufficient documentation

## 2015-10-26 DIAGNOSIS — Z8639 Personal history of other endocrine, nutritional and metabolic disease: Secondary | ICD-10-CM | POA: Insufficient documentation

## 2015-10-26 DIAGNOSIS — Z79899 Other long term (current) drug therapy: Secondary | ICD-10-CM | POA: Insufficient documentation

## 2015-10-26 LAB — CBC WITH DIFFERENTIAL/PLATELET
Basophils Absolute: 0 10*3/uL (ref 0.0–0.1)
Basophils Relative: 0 %
Eosinophils Absolute: 0.1 10*3/uL (ref 0.0–0.7)
Eosinophils Relative: 1 %
HCT: 37.3 % (ref 36.0–46.0)
Hemoglobin: 12.8 g/dL (ref 12.0–15.0)
Lymphocytes Relative: 35 %
Lymphs Abs: 3.3 10*3/uL (ref 0.7–4.0)
MCH: 29.6 pg (ref 26.0–34.0)
MCHC: 34.3 g/dL (ref 30.0–36.0)
MCV: 86.1 fL (ref 78.0–100.0)
Monocytes Absolute: 0.6 10*3/uL (ref 0.1–1.0)
Monocytes Relative: 6 %
Neutro Abs: 5.5 10*3/uL (ref 1.7–7.7)
Neutrophils Relative %: 58 %
Platelets: 317 10*3/uL (ref 150–400)
RBC: 4.33 MIL/uL (ref 3.87–5.11)
RDW: 11.9 % (ref 11.5–15.5)
WBC: 9.5 10*3/uL (ref 4.0–10.5)

## 2015-10-26 LAB — URINALYSIS, ROUTINE W REFLEX MICROSCOPIC
Bilirubin Urine: NEGATIVE
Glucose, UA: NEGATIVE mg/dL
Hgb urine dipstick: NEGATIVE
Ketones, ur: NEGATIVE mg/dL
Nitrite: NEGATIVE
Protein, ur: NEGATIVE mg/dL
Specific Gravity, Urine: 1.03 (ref 1.005–1.030)
pH: 6 (ref 5.0–8.0)

## 2015-10-26 LAB — COMPREHENSIVE METABOLIC PANEL
ALT: 33 U/L (ref 14–54)
AST: 31 U/L (ref 15–41)
Albumin: 3.9 g/dL (ref 3.5–5.0)
Alkaline Phosphatase: 57 U/L (ref 38–126)
Anion gap: 10 (ref 5–15)
BUN: 11 mg/dL (ref 6–20)
CO2: 21 mmol/L — ABNORMAL LOW (ref 22–32)
Calcium: 9.6 mg/dL (ref 8.9–10.3)
Chloride: 109 mmol/L (ref 101–111)
Creatinine, Ser: 0.74 mg/dL (ref 0.44–1.00)
GFR calc Af Amer: >60 mL/min (ref >60)
GFR calc non Af Amer: >60 mL/min (ref >60)
Glucose, Bld: 82 mg/dL (ref 65–99)
Potassium: 3.6 mmol/L (ref 3.5–5.1)
Sodium: 140 mmol/L (ref 135–145)
Total Bilirubin: 0.3 mg/dL (ref 0.3–1.2)
Total Protein: 7.6 g/dL (ref 6.5–8.1)

## 2015-10-26 LAB — URINE MICROSCOPIC-ADD ON

## 2015-10-26 LAB — POC URINE PREG, ED: Preg Test, Ur: NEGATIVE

## 2015-10-26 MED ORDER — CYCLOBENZAPRINE HCL 10 MG PO TABS: 10.0000 mg | ORAL_TABLET | Freq: Two times a day (BID) | ORAL | Status: DC | PRN

## 2015-10-26 NOTE — ED Notes (Signed)
Patient was alert, oriented and stable upon discharge. RN went over AVS and patient had no further questions.  

## 2015-10-26 NOTE — ED Notes (Signed)
Pt reports lower back pain worsening over past week; denies injury of GU symptoms.

## 2015-10-26 NOTE — ED Notes (Signed)
US at bedside

## 2015-10-26 NOTE — ED Provider Notes (Signed)
CSN: [id number]     Arrival date & time 10/26/15  1800 History  By signing my name below, I, Irene Pap, attest that this documentation has been prepared under the direction and in the presence of [employer], PA-C. Electronically Signed: Irene Pap, ED Scribe. 10/26/2015. 10:29 PM.   Chief Complaint  Patient presents with  . Back Pain   The history is provided by the patient. No language interpreter was used.  HPI Comments: Meghan Riggs is a 20 y.o. Female with hx of congenital club foot who presents to the Emergency Department complaining of gradually worsening middle and right lower back pain onset two weeks ago. Pt is unable to sleep through the night due to pain. Pt does a lot of standing and lifting at work. She states that she has been taking ibuprofen and acetaminophen to no relief. She reports associated nausea and darkened urine with RUQ abdominal pain upon palpation. Pt uses NuvaRing for [facility] and has recently had a negative pregnancy test. Pt denies hx of similar symptoms, injury or trauma to the area, fever, chills, dysuria, hematuria, bladder or bowel incontinence. Pt is on Toviaz.   Past Medical History  Diagnosis Date  . Club foot 1996    Congenital  . Hyperlipidemia   . Asthma     Dr. Donneta Romberg   Past Surgical History  Procedure Laterality Date  . Club foot release  793    @ 35 days old  . Knee surgery     Family History  Problem Relation Age of Onset  . Diabetes Father   . Diabetes Paternal Uncle   . Diabetes Paternal Grandmother    Social History  Substance Use Topics  . Smoking status: Never Smoker   . Smokeless tobacco: None  . Alcohol Use: No   OB History    No data available     Review of Systems  All other systems reviewed and are negative.  Allergies  Review of patient's allergies indicates no known allergies.  Home Medications   Prior to Admission medications   Medication Sig Start Date End Date Taking? Authorizing Provider   etonogestrel-ethinyl estradiol (NUVARING) 0.12-0.015 MG/24HR vaginal ring Place 1 each vaginally See admin instructions. Insert vaginally and leave in place for 24 days then puts a new one in on the same day for 24 days and then takes out for four days.   Yes Historical Provider, MD  fesoterodine (TOVIAZ) 8 MG TB24 tablet Take 8 mg by mouth daily.   Yes Historical Provider, MD  ibuprofen (ADVIL,MOTRIN) 200 MG tablet Take 600-800 mg by mouth daily as needed (For pain and inflammation.).    Yes Historical Provider, MD  omeprazole (PRILOSEC) 20 MG capsule Take 20 mg by mouth 2 (two) times daily as needed (For heartburn or acid reflux.).    Yes Historical Provider, MD  PRESCRIPTION MEDICATION Take 1 tablet by mouth 2 (two) times daily as needed (acid reflux).   Yes Historical Provider, MD  cyclobenzaprine (FLEXERIL) 10 MG tablet Take 1 tablet (10 mg total) by mouth 2 (two) times daily as needed for muscle spasms. 10/26/15   Dellis Filbert Leinani Lisbon, PA-C  ondansetron (ZOFRAN) 8 MG tablet Take 1 tablet (8 mg total) by mouth every 8 (eight) hours as needed for nausea or vomiting. Patient not taking: Reported on 04/13/2015 05/29/14   Mercedes Camprubi-Soms, PA-C  oxyCODONE-acetaminophen (PERCOCET) 5-325 MG per tablet Take 1 tablet by mouth every 4 (four) hours as needed for severe pain. Patient not taking: Reported  on 04/13/2015 05/29/14   Daleen Bo, MD  sulfamethoxazole-trimethoprim (BACTRIM DS) 800-160 MG per tablet Take 1 tablet by mouth 2 (two) times daily. Patient not taking: Reported on 04/13/2015 05/29/14   Mercedes Camprubi-Soms, PA-C   BP 129/83 mmHg  Pulse 85  Temp(Src) 98 F (36.7 C) (Oral)  Resp 18  SpO2 100%  LMP 10/13/2015 Physical Exam  Constitutional: She is oriented to person, place, and time. She appears well-developed and well-nourished. No distress.  HENT:  Head: Normocephalic.  Right Ear: Tympanic membrane, external ear and ear canal normal.  Left Ear: Tympanic membrane, external ear and ear  canal normal.  Neck: Normal range of motion. Neck supple.  Cardiovascular: Normal rate and regular rhythm.   Pulmonary/Chest: Effort normal and breath sounds normal.  Abdominal: Soft. There is tenderness in the right upper quadrant.  Mild RUQ TTP  Musculoskeletal: Normal range of motion. She exhibits tenderness. She exhibits no edema.  No C, T, or L spine tenderness to palpation. No obvious signs of trauma, deformity, infection, step-offs. Lung expansion normal. No scoliosis or kyphosis. Bilateral lower extremity strength 5 out of 5, sensation grossly intact, patellar reflexes 2+,Refill less than 3 seconds.  Straight leg negative Ambulates without difficulty   Neurological: She is alert and oriented to person, place, and time.  Skin: Skin is warm and dry. She is not diaphoretic.  Psychiatric: She has a normal mood and affect. Her behavior is normal. Judgment and thought content normal.  Nursing note and vitals reviewed.   ED Course  Procedures (including critical care time) DIAGNOSTIC STUDIES: Oxygen Saturation is 100% on RA, normal by my interpretation.    COORDINATION OF CARE: 9:11 PM-Discussed treatment plan which includes UA and [name] with pt at bedside and pt agreed to plan.   Labs Review Labs Reviewed  URINALYSIS, ROUTINE W REFLEX MICROSCOPIC (NOT AT [facility]) - Abnormal; Notable for the following:    APPearance CLOUDY (*)    Leukocytes, UA TRACE (*)    All other components within normal limits  COMPREHENSIVE METABOLIC PANEL - Abnormal; Notable for the following:    CO2 21 (*)    All other components within normal limits  URINE MICROSCOPIC-ADD ON - Abnormal; Notable for the following:    Squamous Epithelial / LPF 6-30 (*)    Bacteria, UA FEW (*)    All other components within normal limits  CBC WITH DIFFERENTIAL/PLATELET  POC URINE PREG, ED    Imaging Review US Abdomen Limited Ruq  10/26/2015  CLINICAL DATA:  Acute onset of right upper quadrant abdominal pain. Initial  encounter. EXAM: US ABDOMEN LIMITED - RIGHT UPPER QUADRANT COMPARISON:  CT of the abdomen and pelvis from 05/29/2014 FINDINGS: Gallbladder: No gallstones or wall thickening visualized. No sonographic Murphy sign noted by sonographer. Common bile duct: Diameter: 0.4 cm, within normal limits in caliber. Liver: No focal lesion identified. Mildly coarsened hepatic echotexture, nonspecific in appearance. IMPRESSION: No acute abnormality seen at the right upper quadrant. Electronically Signed   By: Garald Balding M.D.   On: 10/26/2015 21:56   I have personally reviewed and evaluated these images and lab results as part of my medical decision-making.   EKG Interpretation None      MDM   Final diagnoses:  RUQ pain  Right-sided low back pain without sciatica   Labs: Posterior break, CMP, CBC, urinalysis  Imaging: Right upper quadrant ultrasound  Consults:  Therapeutics:  Discharge Meds:   Assessment/Plan: Patient's presentation is most consistent with musculoskeletal back pain. She  has no red flags for back pain, no neurological deficits. Patient had some right upper quadrant tenderness, labs are reassuring, no signs on ultrasound indicating any significant pathology. Patient discharged home on Flexeril, symptomatic care instructions, primary care follow-up. Strict return precautions given, she verbalizes understanding and agreement today's plan and had no further questions or concerns at time of discharge.     Okey Regal, PA-C 10/28/15 0425  Forde Dandy, MD 10/28/15 2020

## 2015-10-26 NOTE — Discharge Instructions (Signed)
Please read attached information. If you experience any new or worsening signs or symptoms please return to the emergency room for evaluation. Please follow-up with your primary care provider or specialist as discussed. Please use medication prescribed only as directed and discontinue taking if you have any concerning signs or symptoms.   °

## 2015-11-02 ENCOUNTER — Ambulatory Visit: Payer: Medicaid Other | Admitting: Podiatry

## 2015-11-04 ENCOUNTER — Encounter: Payer: Self-pay | Admitting: Podiatry

## 2015-11-04 ENCOUNTER — Ambulatory Visit (INDEPENDENT_AMBULATORY_CARE_PROVIDER_SITE_OTHER): Payer: Medicaid Other

## 2015-11-04 ENCOUNTER — Ambulatory Visit (INDEPENDENT_AMBULATORY_CARE_PROVIDER_SITE_OTHER): Payer: Medicaid Other | Admitting: Podiatry

## 2015-11-04 VITALS — BP 95/73 | HR 84 | Resp 16

## 2015-11-04 DIAGNOSIS — M79671 Pain in right foot: Secondary | ICD-10-CM

## 2015-11-04 DIAGNOSIS — M79672 Pain in left foot: Principal | ICD-10-CM

## 2015-11-04 DIAGNOSIS — M7750 Other enthesopathy of unspecified foot: Secondary | ICD-10-CM | POA: Diagnosis not present

## 2015-11-04 DIAGNOSIS — M19079 Primary osteoarthritis, unspecified ankle and foot: Secondary | ICD-10-CM

## 2015-11-04 DIAGNOSIS — M779 Enthesopathy, unspecified: Secondary | ICD-10-CM

## 2015-11-04 MED ORDER — TRIAMCINOLONE ACETONIDE 10 MG/ML IJ SUSP
10.0000 mg | Freq: Once | INTRAMUSCULAR | Status: AC
  Administered 2015-11-04: 10 mg

## 2015-11-04 NOTE — Progress Notes (Signed)
   Subjective:    Patient ID: Meghan Riggs, female    DOB: August 07, 1996, 20 y.o.   MRN: [id number]  HPI Pt presents with bilateral foot pain, severe in nature with swelling of ankles. Pain occurs after prolonged walking and can last for a few days   Review of Systems  Musculoskeletal: Positive for gait problem.  All other systems reviewed and are negative.      Objective:   Physical Exam        Assessment & Plan:

## 2015-11-05 NOTE — Progress Notes (Signed)
Subjective:     Patient ID: Meghan Riggs, female   DOB: 12/09/1995, 20 y.o.   MRN: [id number]  HPI patient presents with ankle pain bilateral with history of having procedure for severe foot deformity when she was born and states that she tries to work but her ankles can swell   Review of Systems  All other systems reviewed and are negative.      Objective:   Physical Exam  Constitutional: She is oriented to person, place, and time.  Cardiovascular: Intact distal pulses.   Musculoskeletal: Normal range of motion.  Neurological: She is oriented to person, place, and time.  Skin: Skin is warm.  Nursing note and vitals reviewed.  neurovascular status found to be intact with muscle strength adequate range of motion within normal limits with patient found to have significant discomfort in the sinus tarsi bilateral with moderate depression of the arch range of motion is adequate but what appears to be some form of ankle instability bilateral with swelling.     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 She is going to go to wait for [name] for opinion in the orthopedic department and today capsular sinus tarsi injections administered 3 mg Kenalog 5 mill grams Xylocaine to try to give her temporary relief

## 2015-11-09 ENCOUNTER — Encounter: Payer: Self-pay | Admitting: Podiatry

## 2015-11-09 ENCOUNTER — Ambulatory Visit (INDEPENDENT_AMBULATORY_CARE_PROVIDER_SITE_OTHER): Payer: Medicaid Other | Admitting: Podiatry

## 2015-11-09 ENCOUNTER — Telehealth: Payer: Self-pay | Admitting: *Deleted

## 2015-11-09 VITALS — BP 127/80 | HR 77 | Resp 16

## 2015-11-09 DIAGNOSIS — M19079 Primary osteoarthritis, unspecified ankle and foot: Secondary | ICD-10-CM

## 2015-11-09 DIAGNOSIS — M779 Enthesopathy, unspecified: Secondary | ICD-10-CM

## 2015-11-09 DIAGNOSIS — M7750 Other enthesopathy of unspecified foot: Secondary | ICD-10-CM | POA: Diagnosis not present

## 2015-11-09 MED ORDER — PREDNISONE 10 MG PO TABS: ORAL_TABLET | ORAL | Status: DC

## 2015-11-09 MED ORDER — TRIAMCINOLONE ACETONIDE 10 MG/ML IJ SUSP
10.0000 mg | Freq: Once | INTRAMUSCULAR | Status: AC
  Administered 2015-11-09: 10 mg

## 2015-11-09 NOTE — Telephone Encounter (Signed)
Pt states she was here last week and received injections in her heels and her heels are killing her and she has to go to work, what should she do.  I had schedulers get her into the office today.

## 2015-11-10 NOTE — Progress Notes (Signed)
Subjective:     Patient ID: Meghan Riggs, female   DOB: 1996-06-19, 20 y.o.   MRN: [id number]  HPI patient states that her ankle still quite a bit better but then she in the last couple days it started to develop a lot of pain in the outside of her right foot with out specific injury   Review of Systems     Objective:   Physical Exam Neurovascular status was intact and I did check muscle strength found to be adequate with no indication of tendon disruption or muscle disruption. There is discomfort underneath the lateral malleolus right with the sinus tarsi still painful but moderately improved and quite a bit of improvement on the left. No indication of tendon Dysfunction currently    Assessment:     Inflammatory tendinitis right that may have been due to change in gait as her ankle felt better after sinus tarsi injection    Plan:     Careful sheath injection administered right and then went ahead and applied air fracture walker to immobilize and reduce stress on the tendon. Reappoint to recheck

## 2015-11-26 DIAGNOSIS — M19071 Primary osteoarthritis, right ankle and foot: Secondary | ICD-10-CM | POA: Insufficient documentation

## 2016-11-14 ENCOUNTER — Emergency Department (HOSPITAL_COMMUNITY): Payer: Self-pay

## 2016-11-14 ENCOUNTER — Emergency Department (HOSPITAL_COMMUNITY)
Admission: EM | Admit: 2016-11-14 | Discharge: 2016-11-14 | Disposition: A | Discharge status: Home / Self Care | Payer: Self-pay | Attending: Physician Assistant | Admitting: Physician Assistant

## 2016-11-14 ENCOUNTER — Encounter (HOSPITAL_COMMUNITY): Payer: Self-pay | Admitting: Emergency Medicine

## 2016-11-14 DIAGNOSIS — J069 Acute upper respiratory infection, unspecified: Secondary | ICD-10-CM | POA: Insufficient documentation

## 2016-11-14 DIAGNOSIS — J399 Disease of upper respiratory tract, unspecified: Secondary | ICD-10-CM

## 2016-11-14 DIAGNOSIS — J45909 Unspecified asthma, uncomplicated: Secondary | ICD-10-CM | POA: Insufficient documentation

## 2016-11-14 LAB — POC URINE PREG, ED: PREG TEST UR: NEGATIVE

## 2016-11-14 MED ORDER — IBUPROFEN 800 MG PO TABS: 800.0000 mg | ORAL_TABLET | Freq: Three times a day (TID) | ORAL | 0 refills | Status: DC

## 2016-11-14 MED ORDER — IBUPROFEN 400 MG PO TABS
600.0000 mg | ORAL_TABLET | Freq: Once | ORAL | Status: AC
  Administered 2016-11-14: 600 mg via ORAL
  Filled 2016-11-14: qty 1

## 2016-11-14 MED ORDER — ACETAMINOPHEN 500 MG PO TABS: 500.0000 mg | ORAL_TABLET | Freq: Four times a day (QID) | ORAL | 0 refills | Status: DC | PRN

## 2016-11-14 NOTE — ED Notes (Signed)
Patient Alert and oriented X4. Stable and ambulatory. Patient verbalized understanding of the discharge instructions.  Patient belongings were taken by the patient.  

## 2016-11-14 NOTE — ED Triage Notes (Signed)
Chills hot cold cough and chest congestion  Since yesterday

## 2016-11-14 NOTE — ED Provider Notes (Signed)
[facility] DEPT Provider Note   CSN: [id number] Arrival date & time: 11/14/16  1707  By signing my name below, I, Neta Mends, attest that this documentation has been prepared under the direction and in the presence of Melvinia Ashby Julio Alm, MD . Electronically Signed: Neta Mends, ED Scribe. 11/14/2016. 7:30 PM.    History   Chief Complaint Chief Complaint  Patient presents with  . Cough  . Diarrhea  . Influenza  . Chest Pain    The history is provided by the patient. No language interpreter was used.   HPI Comments:  Meghan Riggs is a 21 y.o. female who presents to the Emergency Department complaining of a worsening, persistent cough since yesterday. Pt states that when she coughs she has chest pain and a headache. Pt complains of associated rhinorrhea, diarrhea, sneezing, eye watering, diaphoresis, chills. Pt reports possible sick contact with her stepson. Pt has taken OTC cold and flu medication with no relief. Pt denies myalgias.   Past Medical History:  Diagnosis Date  . Asthma    Dr. Donneta Romberg  . Club foot 1996   Congenital  . Hyperlipidemia     There are no active problems to display for this patient.   Past Surgical History:  Procedure Laterality Date  . CLUB FOOT RELEASE  2232   @ 61 days old  . KNEE SURGERY      OB History    No data available       Home Medications    Prior to Admission medications   Medication Sig Start Date End Date Taking? Authorizing Provider  cyclobenzaprine (FLEXERIL) 10 MG tablet Take 1 tablet (10 mg total) by mouth 2 (two) times daily as needed for muscle spasms. 10/26/15   Okey Regal, PA-C  etonogestrel-ethinyl estradiol (NUVARING) 0.12-0.015 MG/24HR vaginal ring Place 1 each vaginally See admin instructions. Insert vaginally and leave in place for 24 days then puts a new one in on the same day for 24 days and then takes out for four days.    Historical Provider, MD  fesoterodine (TOVIAZ) 8 MG TB24  tablet Take 8 mg by mouth daily.    Historical Provider, MD  ibuprofen (ADVIL,MOTRIN) 200 MG tablet Take 600-800 mg by mouth daily as needed (For pain and inflammation.).     Historical Provider, MD  omeprazole (PRILOSEC) 20 MG capsule Take 20 mg by mouth 2 (two) times daily as needed (For heartburn or acid reflux.).     Historical Provider, MD  ondansetron (ZOFRAN) 8 MG tablet Take 1 tablet (8 mg total) by mouth every 8 (eight) hours as needed for nausea or vomiting. 05/29/14   Mercedes Street, PA-C  predniSONE (DELTASONE) 10 MG tablet 12 day tapering dose 11/09/15   Wallene Huh, DPM  PRESCRIPTION MEDICATION Take 1 tablet by mouth 2 (two) times daily as needed (acid reflux).    Historical Provider, MD    Family History Family History  Problem Relation Age of Onset  . Diabetes Father   . Diabetes Paternal Uncle   . Diabetes Paternal Grandmother     Social History Social History  Substance Use Topics  . Smoking status: Never Smoker  . Smokeless tobacco: Never Used  . Alcohol use No     Allergies   Patient has no known allergies.   Review of Systems Review of Systems  Constitutional: Positive for chills, diaphoresis and fatigue.  HENT: Positive for rhinorrhea and sneezing.   Respiratory: Positive for cough.   Cardiovascular:  Positive for chest pain.  Gastrointestinal: Positive for diarrhea.  Musculoskeletal: Negative for myalgias.  Neurological: Positive for headaches.  All other systems reviewed and are negative.    Physical Exam Updated Vital Signs BP 139/96   Pulse 86   Temp 99.4 F (37.4 C) (Oral)   Resp 16   SpO2 100%   Physical Exam  Constitutional: She appears well-developed and well-nourished. No distress.  HENT:  Head: Normocephalic and atraumatic.  Eyes: Conjunctivae are normal.  Cardiovascular: Normal rate.   Pulmonary/Chest: Effort normal.  Abdominal: She exhibits no distension.  Neurological: She is alert.  Skin: Skin is warm and dry.    Psychiatric: She has a normal mood and affect.  Nursing note and vitals reviewed.    ED Treatments / Results  DIAGNOSTIC STUDIES:  Oxygen Saturation is 100% on RA, normal by my interpretation.    COORDINATION OF CARE:  7:30 PM Pt instructed to alternate between tylenol and ibuprofen. Discussed treatment plan with pt at bedside and pt agreed to plan.   Labs (all labs ordered are listed, but only abnormal results are displayed) Labs Reviewed  POC URINE PREG, ED    EKG  EKG Interpretation  Date/Time:  Monday November 14 2016 17:22:17 EST Ventricular Rate:  80 PR Interval:  148 QRS Duration: 80 QT Interval:  354 QTC Calculation: 408 R Axis:   90 Text Interpretation:  Normal sinus rhythm with sinus arrhythmia Rightward axis Nonspecific T wave abnormality Abnormal ECG no ischemia noted Confirmed by Gerald Leitz ([id number]) on 11/14/2016 7:08:08 PM       Radiology Dg Chest 2 View  Result Date: 11/14/2016 CLINICAL DATA:  Chest pain and cough EXAM: CHEST  2 VIEW COMPARISON:  None. FINDINGS: Lungs are clear. Heart size and pulmonary vascularity are normal. No adenopathy. No pneumothorax. No bone lesions. IMPRESSION: No edema or consolidation. Electronically Signed   By: Lowella Grip III M.D.   On: 11/14/2016 18:32    Procedures Procedures (including critical care time)  Medications Ordered in ED Medications - No data to display   Initial Impression / Assessment and Plan / ED Course  I have reviewed the triage vital signs and the nursing notes.  Pertinent labs & imaging results that were available during my care of the patient were reviewed by me and considered in my medical decision making (see chart for details).    I personally performed the services described in this documentation, which was scribed in my presence. The recorded information has been reviewed and is accurate.   Patient is a 21 year old female presenting with cough, fatigue. Patient says that her  son was sent home from school ill. Patient has a little bit of congestion. No body aches. No fevers. I think this is likely a virus. Chest x-ray is normal. Physical exam is normal. We'll send home with ibuprofen and Tylenol instructions to follow up with any concerns.  Final Clinical Impressions(s) / ED Diagnoses   Final diagnoses:  None    New Prescriptions New Prescriptions   No medications on file     Roth Ress Julio Alm, MD 11/14/16 1942

## 2016-11-14 NOTE — Discharge Instructions (Signed)
Please return with high fevers, shortness of breath or other concerns.

## 2017-01-04 ENCOUNTER — Encounter (HOSPITAL_COMMUNITY): Payer: Self-pay | Admitting: Emergency Medicine

## 2017-01-04 ENCOUNTER — Emergency Department (HOSPITAL_COMMUNITY)
Admission: EM | Admit: 2017-01-04 | Discharge: 2017-01-04 | Disposition: A | Discharge status: Home / Self Care | Payer: Medicaid Other | Attending: Emergency Medicine | Admitting: Emergency Medicine

## 2017-01-04 DIAGNOSIS — J45909 Unspecified asthma, uncomplicated: Secondary | ICD-10-CM | POA: Insufficient documentation

## 2017-01-04 DIAGNOSIS — R21 Rash and other nonspecific skin eruption: Secondary | ICD-10-CM

## 2017-01-04 DIAGNOSIS — L509 Urticaria, unspecified: Secondary | ICD-10-CM | POA: Insufficient documentation

## 2017-01-04 MED ORDER — DIPHENHYDRAMINE HCL 25 MG PO CAPS
50.0000 mg | ORAL_CAPSULE | Freq: Once | ORAL | Status: AC
  Administered 2017-01-04: 50 mg via ORAL
  Filled 2017-01-04: qty 2

## 2017-01-04 MED ORDER — TRIAMCINOLONE ACETONIDE 0.1 % EX CREA: 1.0000 "application " | TOPICAL_CREAM | Freq: Two times a day (BID) | CUTANEOUS | 0 refills | Status: DC

## 2017-01-04 MED ORDER — IBUPROFEN 200 MG PO TABS
600.0000 mg | ORAL_TABLET | Freq: Once | ORAL | Status: AC
  Administered 2017-01-04: 600 mg via ORAL
  Filled 2017-01-04: qty 1

## 2017-01-04 MED ORDER — FAMOTIDINE 20 MG PO TABS
20.0000 mg | ORAL_TABLET | Freq: Once | ORAL | Status: AC
  Administered 2017-01-04: 20 mg via ORAL
  Filled 2017-01-04: qty 1

## 2017-01-04 MED ORDER — DEXAMETHASONE SODIUM PHOSPHATE 10 MG/ML IJ SOLN
10.0000 mg | Freq: Once | INTRAMUSCULAR | Status: AC
  Administered 2017-01-04: 10 mg
  Filled 2017-01-04: qty 1

## 2017-01-04 MED ORDER — DEXAMETHASONE 10 MG/ML FOR PEDIATRIC ORAL USE
10.0000 mg | Freq: Once | INTRAMUSCULAR | Status: DC
  Filled 2017-01-04: qty 1

## 2017-01-04 NOTE — ED Triage Notes (Signed)
Pt sts bug bites to left upper arm that are red and painful

## 2017-01-04 NOTE — Discharge Instructions (Signed)
Wound could likely be due to an insect bite. Does not look infected at this time. Apply the steroid cream as instructed. Take Benadryl for itching and inflammation. Motrin and Tylenol for pain. Cold compresses. I have outlined the area. If you develop any worsening swelling or redness outside this area or developing fevers please return to ED.

## 2017-01-04 NOTE — ED Notes (Signed)
Pt given meds and then states that she has other areas that are itching, has hive like rash on rt arm

## 2017-01-04 NOTE — ED Provider Notes (Signed)
[facility] DEPT Provider Note   CSN: [id number] Arrival date & time: 01/04/17  1109   By signing my name below, I, Delton Prairie, attest that this documentation has been prepared under the direction and in the presence of  Ocie Cornfield, PA-C. Electronically Signed: Delton Prairie, ED Scribe. 01/04/17. 11:49 AM.   History   Chief Complaint Chief Complaint  Patient presents with  . Insect Bite    HPI Comments:  Meghan Riggs is a 21 y.o. female who presents to the Emergency Department complaining of acute onset, gradually worsening area of swelling secondary to an unwitnessed insect bite to her left arm x today. Pt also reports associated redness and pain with mild pruritis. She states she initially saw 4 bug bites to the area and notes the affected area gradually worsened.The insect bite was but not the left side however is noticed when she woke up this morning. Pt states her pain is localized from her left shoulder and radiates up to the left side of her neck. No alleviating factors noted.  Pt denies paresthesias, weakness, fever, nausea, emesis, signs of insect or any other associated symptoms. No other complaints noted.    The history is provided by the patient. No language interpreter was used.    Past Medical History:  Diagnosis Date  . Asthma    Dr. Donneta Romberg  . Club foot 1996   Congenital  . Hyperlipidemia     There are no active problems to display for this patient.   Past Surgical History:  Procedure Laterality Date  . CLUB FOOT RELEASE  3923   @ 43 days old  . KNEE SURGERY      OB History    No data available       Home Medications    Prior to Admission medications   Medication Sig Start Date End Date Taking? Authorizing Provider  acetaminophen (TYLENOL) 500 MG tablet Take 1 tablet (500 mg total) by mouth every 6 (six) hours as needed. 11/14/16   Courteney Lyn Mackuen, MD  cyclobenzaprine (FLEXERIL) 10 MG tablet Take 1 tablet (10 mg total) by mouth 2  (two) times daily as needed for muscle spasms. 10/26/15   Okey Regal, PA-C  etonogestrel-ethinyl estradiol (NUVARING) 0.12-0.015 MG/24HR vaginal ring Place 1 each vaginally See admin instructions. Insert vaginally and leave in place for 24 days then puts a new one in on the same day for 24 days and then takes out for four days.    Historical Provider, MD  fesoterodine (TOVIAZ) 8 MG TB24 tablet Take 8 mg by mouth daily.    Historical Provider, MD  ibuprofen (ADVIL,MOTRIN) 200 MG tablet Take 600-800 mg by mouth daily as needed (For pain and inflammation.).     Historical Provider, MD  ibuprofen (ADVIL,MOTRIN) 800 MG tablet Take 1 tablet (800 mg total) by mouth 3 (three) times daily. 11/14/16   Courteney Lyn Mackuen, MD  omeprazole (PRILOSEC) 20 MG capsule Take 20 mg by mouth 2 (two) times daily as needed (For heartburn or acid reflux.).     Historical Provider, MD  ondansetron (ZOFRAN) 8 MG tablet Take 1 tablet (8 mg total) by mouth every 8 (eight) hours as needed for nausea or vomiting. 05/29/14   Mercedes Street, PA-C  predniSONE (DELTASONE) 10 MG tablet 12 day tapering dose 11/09/15   Wallene Huh, DPM  PRESCRIPTION MEDICATION Take 1 tablet by mouth 2 (two) times daily as needed (acid reflux).    Historical Provider, MD    [facility]  History Family History  Problem Relation Age of Onset  . Diabetes Father   . Diabetes Paternal Uncle   . Diabetes Paternal Grandmother     Social History Social History  Substance Use Topics  . Smoking status: Never Smoker  . Smokeless tobacco: Never Used  . Alcohol use No     Allergies   Patient has no known allergies.   Review of Systems Review of Systems  Constitutional: Negative for fever.  Musculoskeletal: Positive for myalgias.  Skin: Positive for color change.  All other systems reviewed and are negative.    Physical Exam Updated Vital Signs BP 128/74 (BP Location: Right Arm)   Pulse 84   Temp 99.1 F (37.3 C) (Oral)   Resp 18   SpO2  100%   Physical Exam  Constitutional: She is oriented to person, place, and time. She appears well-developed and well-nourished. No distress.  HENT:  Head: Normocephalic and atraumatic.  Eyes: Conjunctivae are normal.  Cardiovascular: Normal rate.   Pulses:      Radial pulses are 2+ on the right side, and 2+ on the left side.  Radial pulses 2+  Pulmonary/Chest: Effort normal.  Abdominal: She exhibits no distension.  Musculoskeletal: She exhibits edema.  Strength is normal. Sensation intact. FROM.   Neurological: She is alert and oriented to person, place, and time.  Skin: Skin is warm and dry. Rash noted.  Urticarial wheel to the medial aspect of the upper left arm. Edema noted. Minimal erythema. No area of fluctuance. Some induration and the area is blanchable.   Psychiatric: She has a normal mood and affect.  Nursing note and vitals reviewed.      ED Treatments / Results  DIAGNOSTIC STUDIES:  Oxygen Saturation is 100% on RA, normal by my interpretation.    COORDINATION OF CARE:  11:45 AM Discussed treatment plan with pt at bedside and pt agreed to plan.  Labs (all labs ordered are listed, but only abnormal results are displayed) Labs Reviewed - No data to display  EKG  EKG Interpretation None       Radiology No results found.  Procedures Procedures (including critical care time)  Medications Ordered in ED Medications - No data to display   Initial Impression / Assessment and Plan / ED Course  I have reviewed the triage vital signs and the nursing notes.  Pertinent labs & imaging results that were available during my care of the patient were reviewed by me and considered in my medical decision making (see chart for details).      Patient with urticarial eruption to the right arms. No specific trigger. Possibly bitten by something last night. It was unwitnessed. Normal last night. Describes pain and mild pruritus to the area. No secondary infection  noted. No area of fluctuance concerning for an abscess. Doubt cellulitis given the acute onset. No secondary symptoms including nausea, vomiting, fever. Area has been demarcated. Will not treat antibodies this time. Moderate tender to palpation. No edema to the arm. Neurovascularly intact. Exam seems consistent with possible insect bite with reaction. No signs of anaphylactic reaction; no new medications. Will treat with Kenalog, Benadryl, Motrin, Tylenol, ice compresses. Follow up with PCP in 2-3 days. Return precautions discussed. Pt is safe for discharge at this time.     Upon discharge nurse told me that patient has developed similar areas to her right arm. On reevaluation she does have urticarial hives noted to the right arm that are mildly erythematous. They're mildly pruritic and  tender to palpation. Patient denies any recent new soaps, foods, medicines. No recent infections. Doubt erythema nodosum. Seems like urticarial eruptions to unknown etiology. Given the bilateral nature have low suspicion for insect bite or cellulitis at this time. Patient was given Benadryl, Decadron, Pepcid. Will be discharged with Benadryl, Kenalog cream. On reevaluation patient has no difficulty breathing or swallowing. No anaphylaxis noted. No angioedema. She is sleeping on reexamination. Feels stable for discharge. Have encouraged patient to follow-up if symptoms worsen or she develops any difficulties breathing. Discussed patient with Dr. Rex Kras who is agreeable to above plan.  Final Clinical Impressions(s) / ED Diagnoses   Final diagnoses:  Rash and nonspecific skin eruption    New Prescriptions New Prescriptions   TRIAMCINOLONE CREAM (KENALOG) 0.1 %    Apply 1 application topically 2 (two) times daily. Apply to the area  I personally performed the services described in this documentation, which was scribed in my presence. The recorded information has been reviewed and is accurate.     Doristine Devoid,  PA-C 01/04/17 1210    Doristine Devoid, PA-C 01/04/17 [place], MD 01/04/17 [phone]

## 2017-01-06 ENCOUNTER — Emergency Department (HOSPITAL_COMMUNITY)
Admission: EM | Admit: 2017-01-06 | Discharge: 2017-01-06 | Disposition: A | Discharge status: Home / Self Care | Payer: Medicaid Other | Attending: Emergency Medicine | Admitting: Emergency Medicine

## 2017-01-06 ENCOUNTER — Encounter (HOSPITAL_COMMUNITY): Payer: Self-pay | Admitting: Nurse Practitioner

## 2017-01-06 DIAGNOSIS — Z79899 Other long term (current) drug therapy: Secondary | ICD-10-CM | POA: Insufficient documentation

## 2017-01-06 DIAGNOSIS — J45909 Unspecified asthma, uncomplicated: Secondary | ICD-10-CM | POA: Insufficient documentation

## 2017-01-06 DIAGNOSIS — R21 Rash and other nonspecific skin eruption: Secondary | ICD-10-CM | POA: Insufficient documentation

## 2017-01-06 MED ORDER — DOXYCYCLINE HYCLATE 100 MG PO CAPS: 100.0000 mg | ORAL_CAPSULE | Freq: Two times a day (BID) | ORAL | 0 refills | Status: AC

## 2017-01-06 NOTE — Discharge Instructions (Signed)
Please take doxycycline twice a day for 14 days. Please make sure to find a primary care provider using the financial resource guide attached and schedule appointment as soon as can be seen within 1-2 weeks.  Get help right away if: You have an irregular heartbeat. You have nerve pain. Your face feels numb.

## 2017-01-06 NOTE — ED Triage Notes (Signed)
Pt presents with c/o rash. The rash began Wednesday. She describes rash as painful, itchy, sore. She was seen here and treated for an allergic reaction with benadryl, oral steroids, topical triamcinolone, motrin. She has been taking the medications as prescribed and the rash seemed to get a little better initially but then started to spread to back, arms.

## 2017-01-06 NOTE — ED Provider Notes (Signed)
Meghan Riggs Provider Note   CSN: [id number] Arrival date & time: 01/06/17  1615   By signing my name below, I, Meghan Riggs, attest that this documentation has been prepared under the direction and in the presence of  Meghan Riggs- PA-C. Electronically Signed: Delton Riggs, ED Scribe. 01/06/17. 5:53 PM.   History   Chief Complaint Chief Complaint  Patient presents with  . Skin Problem    HPI Comments:  Meghan Riggs is a 21 y.o. female, with a PMHx of eczema, who presents to the Emergency Department complaining of ongoing, gradually worsening rash with associated pain to her bilateral upper extremities and back x 2 days. She describes her pain as an aching sensation at rest and as a sharp sensation with contact to the affected areas of her skin. Her pain is worse with palpation. She also reports associated itching. She has used topical triamcinolone, taken benadryl and oral steroid with mild relief. She states the rash she had on her left arm that was seen on Wednesday has slightly improved. Pt denies fevers, chills, nausea, vomiting, diarrhea, abdominal pain, chest pain, SOB or any other associated symptoms. Pt also denies a hx of DM, new medication use, a change in laundry detergents/creams, a hx of tick bites or any recent sick contacts with similar symptoms. No PCP noted her pt. No other complaints noted.   The history is provided by the patient. No language interpreter was used.    Past Medical History:  Diagnosis Date  . Asthma    Dr. Donneta Romberg  . Club foot 1996   Congenital  . Hyperlipidemia     There are no active problems to display for this patient.   Past Surgical History:  Procedure Laterality Date  . CLUB FOOT RELEASE  4350   @ 50 days old  . KNEE SURGERY      OB History    No data available       Home Medications    Prior to Admission medications   Medication Sig Start Date End Date Taking? Authorizing Provider  acetaminophen (TYLENOL)  500 MG tablet Take 1 tablet (500 mg total) by mouth every 6 (six) hours as needed. 11/14/16   Meghan Lyn Mackuen, MD  cyclobenzaprine (FLEXERIL) 10 MG tablet Take 1 tablet (10 mg total) by mouth 2 (two) times daily as needed for muscle spasms. 10/26/15   Okey Regal, PA-C  doxycycline (VIBRAMYCIN) 100 MG capsule Take 1 capsule (100 mg total) by mouth 2 (two) times daily. 01/06/17 01/20/17  Meghan Riggs, [place]  etonogestrel-ethinyl estradiol (NUVARING) 0.12-0.015 MG/24HR vaginal ring Place 1 each vaginally See admin instructions. Insert vaginally and leave in place for 24 days then puts a new one in on the same day for 24 days and then takes out for four days.    Historical Provider, MD  fesoterodine (TOVIAZ) 8 MG TB24 tablet Take 8 mg by mouth daily.    Historical Provider, MD  ibuprofen (ADVIL,MOTRIN) 200 MG tablet Take 600-800 mg by mouth daily as needed (For pain and inflammation.).     Historical Provider, MD  ibuprofen (ADVIL,MOTRIN) 800 MG tablet Take 1 tablet (800 mg total) by mouth 3 (three) times daily. 11/14/16   Meghan Lyn Mackuen, MD  omeprazole (PRILOSEC) 20 MG capsule Take 20 mg by mouth 2 (two) times daily as needed (For heartburn or acid reflux.).     Historical Provider, MD  ondansetron (ZOFRAN) 8 MG tablet Take 1 tablet (8 mg total) by mouth every  8 (eight) hours as needed for nausea or vomiting. 05/29/14   Meghan Street, PA-C  predniSONE (DELTASONE) 10 MG tablet 12 day tapering dose 11/09/15   Meghan Riggs, DPM  PRESCRIPTION MEDICATION Take 1 tablet by mouth 2 (two) times daily as needed (acid reflux).    Historical Provider, MD  triamcinolone cream (KENALOG) 0.1 % Apply 1 application topically 2 (two) times daily. Apply to the area 01/04/17   Meghan Devoid, PA-C    Family History Family History  Problem Relation Age of Onset  . Diabetes Father   . Diabetes Paternal Uncle   . Diabetes Paternal Grandmother     Social History Social History  Substance Use  Topics  . Smoking status: Never Smoker  . Smokeless tobacco: Never Used  . Alcohol use No     Allergies   Patient has no known allergies.   Review of Systems Review of Systems  Constitutional: Negative for chills and fever.  Respiratory: Negative for shortness of breath.   Cardiovascular: Negative for chest pain.  Gastrointestinal: Negative for abdominal pain, diarrhea, nausea and vomiting.  Skin: Positive for rash.    Physical Exam Updated Vital Signs BP 113/66   Pulse 80   Temp 98.9 F (37.2 C) (Oral)   Resp 14   SpO2 100%   Physical Exam  Constitutional: She is oriented to person, place, and time. She appears well-developed and well-nourished.  Well appearing. Patient has patent airway without stridor and is handling secretions without difficulty. No angioedema.  HENT:  Head: Normocephalic and atraumatic.  Nose: Nose normal.  Eyes: Conjunctivae and EOM are normal.  Neck: Normal range of motion.  Cardiovascular: Normal rate, normal heart sounds and intact distal pulses.   No murmur heard. Pulmonary/Chest: Effort normal and breath sounds normal. No respiratory distress. She has no wheezes. She has no rales.  Normal work of breathing. No respiratory distress noted.   Abdominal: Soft. Bowel sounds are normal. There is no tenderness. There is no rebound and no guarding.  Musculoskeletal: Normal range of motion. She exhibits tenderness. She exhibits no edema.  Neurological: She is alert and oriented to person, place, and time.  Skin: Skin is warm. Rash noted. There is erythema.  2 cm target lesion to the right trapezius muscle. Pt also has 2 other lesions, smaller in size, that are not target lesions. Left upper arm shows area previously marked showing moderate improvement but still shows mild areas of redness. No areas with vesicles, bullae or breaks in skin. The areas are TTP. No swelling noted. No other discolorations noted. No evidence of ticks noted on exam.  No  blisters, no pustules, no draining sinus tracts, no superficial abscesses, no bullous impetigo, no vesicles, no desquamation.  Psychiatric: She has a normal mood and affect. Her behavior is normal.  Nursing note and vitals reviewed.        ED Treatments / Results  DIAGNOSTIC STUDIES:  Oxygen Saturation is 98% on RA, normal by my interpretation.    COORDINATION OF CARE:  5:40 PM Discussed treatment plan with pt at bedside and pt agreed to plan.  Labs (all labs ordered are listed, but only abnormal results are displayed) Labs Reviewed - No data to display  EKG  EKG Interpretation None       Radiology No results found.  Procedures Procedures (including critical care time)  Medications Ordered in ED Medications - No data to display   Initial Impression / Assessment and Plan / ED Course  I have reviewed the triage vital signs and the nursing notes.  Pertinent labs & imaging results that were available during my care of the patient were reviewed by me and considered in my medical decision making (see chart for details).    Patient with concern for Lyme disease due to clinical picture. Patient will be given doxycycline for 2 weeks and strict instructions to follow up with a primary care provider within 1-2 weeks. Patient instructed to keep area clean and dry. No signs of secondary infection. Heart and lung sounds are clear. Abdomen soft and nontender. No evidence of ticks visualized on exam. Patient has patent airway without stridor and is handling secretions without difficulty. No angioedema. No blisters, no pustules, no draining sinus tracts, no superficial abscesses, no bullous impetigo, no vesicles, no desquamation. No concern for SJS, TEE and, TSS, syphilis at this time. I feel patient is safe for discharge at this time. Reasons to immediately return to the ED discussed.   Final Clinical Impressions(s) / ED Diagnoses   Final diagnoses:  Rash    New  Prescriptions Discharge Medication List as of 01/06/2017  6:17 PM    START taking these medications   Details  doxycycline (VIBRAMYCIN) 100 MG capsule Take 1 capsule (100 mg total) by mouth 2 (two) times daily., Starting Fri 01/06/2017, Until Fri 01/20/2017, Print      I personally performed the services described in this documentation, which was scribed in my presence. The recorded information has been reviewed and is accurate.    [place], [place] 01/06/17 1827    Veryl Speak, MD 01/06/17 [phone]

## 2017-06-04 ENCOUNTER — Observation Stay (HOSPITAL_COMMUNITY)
Admission: EM | Admit: 2017-06-04 | Discharge: 2017-06-05 | Disposition: A | Discharge status: Home / Self Care | Payer: Self-pay | Attending: Surgery | Admitting: Surgery

## 2017-06-04 ENCOUNTER — Emergency Department (HOSPITAL_COMMUNITY): Payer: Self-pay | Admitting: Certified Registered Nurse Anesthetist

## 2017-06-04 ENCOUNTER — Encounter (HOSPITAL_COMMUNITY)
Admission: EM | Disposition: A | Discharge status: Home / Self Care | Payer: Self-pay | Source: Home / Self Care | Attending: Emergency Medicine

## 2017-06-04 ENCOUNTER — Emergency Department (HOSPITAL_COMMUNITY): Payer: Self-pay

## 2017-06-04 ENCOUNTER — Encounter (HOSPITAL_COMMUNITY): Payer: Self-pay | Admitting: Emergency Medicine

## 2017-06-04 DIAGNOSIS — Z6841 Body Mass Index (BMI) 40.0 and over, adult: Secondary | ICD-10-CM | POA: Insufficient documentation

## 2017-06-04 DIAGNOSIS — J45909 Unspecified asthma, uncomplicated: Secondary | ICD-10-CM | POA: Insufficient documentation

## 2017-06-04 DIAGNOSIS — K219 Gastro-esophageal reflux disease without esophagitis: Secondary | ICD-10-CM | POA: Insufficient documentation

## 2017-06-04 DIAGNOSIS — K352 Acute appendicitis with generalized peritonitis, without abscess: Secondary | ICD-10-CM

## 2017-06-04 DIAGNOSIS — E785 Hyperlipidemia, unspecified: Secondary | ICD-10-CM | POA: Insufficient documentation

## 2017-06-04 DIAGNOSIS — Z7982 Long term (current) use of aspirin: Secondary | ICD-10-CM | POA: Insufficient documentation

## 2017-06-04 DIAGNOSIS — K353 Acute appendicitis with localized peritonitis: Principal | ICD-10-CM | POA: Insufficient documentation

## 2017-06-04 DIAGNOSIS — Z79899 Other long term (current) drug therapy: Secondary | ICD-10-CM | POA: Insufficient documentation

## 2017-06-04 DIAGNOSIS — K3580 Unspecified acute appendicitis: Secondary | ICD-10-CM | POA: Diagnosis present

## 2017-06-04 DIAGNOSIS — Z23 Encounter for immunization: Secondary | ICD-10-CM | POA: Insufficient documentation

## 2017-06-04 DIAGNOSIS — K760 Fatty (change of) liver, not elsewhere classified: Secondary | ICD-10-CM | POA: Insufficient documentation

## 2017-06-04 HISTORY — PX: LAPAROSCOPIC APPENDECTOMY: SHX408

## 2017-06-04 LAB — LIPASE, BLOOD: Lipase: 28 U/L (ref 11–51)

## 2017-06-04 LAB — COMPREHENSIVE METABOLIC PANEL
ALK PHOS: 59 U/L (ref 38–126)
ALT: 52 U/L (ref 14–54)
AST: 55 U/L — AB (ref 15–41)
Albumin: 3.7 g/dL (ref 3.5–5.0)
Anion gap: 9 (ref 5–15)
BILIRUBIN TOTAL: 0.6 mg/dL (ref 0.3–1.2)
BUN: 5 mg/dL — AB (ref 6–20)
CALCIUM: 9.1 mg/dL (ref 8.9–10.3)
CO2: 22 mmol/L (ref 22–32)
CREATININE: 0.66 mg/dL (ref 0.44–1.00)
Chloride: 102 mmol/L (ref 101–111)
GFR calc Af Amer: >60 mL/min (ref >60)
GFR calc non Af Amer: >60 mL/min (ref >60)
GLUCOSE: 81 mg/dL (ref 65–99)
Potassium: 3.9 mmol/L (ref 3.5–5.1)
Sodium: 133 mmol/L — ABNORMAL LOW (ref 135–145)
TOTAL PROTEIN: 6.8 g/dL (ref 6.5–8.1)

## 2017-06-04 LAB — URINALYSIS, ROUTINE W REFLEX MICROSCOPIC
Bilirubin Urine: NEGATIVE
GLUCOSE, UA: NEGATIVE mg/dL
HGB URINE DIPSTICK: NEGATIVE
KETONES UR: NEGATIVE mg/dL
Leukocytes, UA: NEGATIVE
Nitrite: NEGATIVE
PH: 6 (ref 5.0–8.0)
PROTEIN: NEGATIVE mg/dL
Specific Gravity, Urine: 1.01 (ref 1.005–1.030)

## 2017-06-04 LAB — CBC
HEMATOCRIT: 40.4 % (ref 36.0–46.0)
Hemoglobin: 13.7 g/dL (ref 12.0–15.0)
MCH: 30.3 pg (ref 26.0–34.0)
MCHC: 33.9 g/dL (ref 30.0–36.0)
MCV: 89.4 fL (ref 78.0–100.0)
Platelets: 286 10*3/uL (ref 150–400)
RBC: 4.52 MIL/uL (ref 3.87–5.11)
RDW: 12.2 % (ref 11.5–15.5)
WBC: 16.4 10*3/uL — ABNORMAL HIGH (ref 4.0–10.5)

## 2017-06-04 LAB — POC URINE PREG, ED: Preg Test, Ur: NEGATIVE

## 2017-06-04 SURGERY — APPENDECTOMY, LAPAROSCOPIC
Anesthesia: General | Site: Abdomen

## 2017-06-04 MED ORDER — ONDANSETRON HCL 4 MG/2ML IJ SOLN
4.0000 mg | Freq: Once | INTRAMUSCULAR | Status: AC
  Administered 2017-06-04: 4 mg via INTRAVENOUS
  Filled 2017-06-04: qty 2

## 2017-06-04 MED ORDER — BUPIVACAINE-EPINEPHRINE (PF) 0.25% -1:200000 IJ SOLN
INTRAMUSCULAR | Status: AC
  Filled 2017-06-04: qty 30

## 2017-06-04 MED ORDER — FENTANYL CITRATE (PF) 250 MCG/5ML IJ SOLN
INTRAMUSCULAR | Status: AC
  Filled 2017-06-04: qty 5

## 2017-06-04 MED ORDER — METHOCARBAMOL 500 MG PO TABS
500.0000 mg | ORAL_TABLET | Freq: Four times a day (QID) | ORAL | Status: DC | PRN
  Administered 2017-06-05: 500 mg via ORAL
  Filled 2017-06-04: qty 1

## 2017-06-04 MED ORDER — ONDANSETRON HCL 4 MG/2ML IJ SOLN
INTRAMUSCULAR | Status: DC | PRN
  Administered 2017-06-04: 4 mg via INTRAVENOUS

## 2017-06-04 MED ORDER — LACTATED RINGERS IV SOLN
INTRAVENOUS | Status: DC
  Administered 2017-06-04 (×2): via INTRAVENOUS

## 2017-06-04 MED ORDER — OXYCODONE HCL 5 MG PO TABS
5.0000 mg | ORAL_TABLET | ORAL | Status: DC | PRN
  Administered 2017-06-04: 10 mg via ORAL
  Administered 2017-06-04 – 2017-06-05 (×3): 5 mg via ORAL
  Filled 2017-06-04: qty 2
  Filled 2017-06-04: qty 1
  Filled 2017-06-04: qty 2

## 2017-06-04 MED ORDER — SODIUM CHLORIDE 0.9 % IR SOLN
Status: DC | PRN
  Administered 2017-06-04: 1000 mL

## 2017-06-04 MED ORDER — 0.9 % SODIUM CHLORIDE (POUR BTL) OPTIME
TOPICAL | Status: DC | PRN
  Administered 2017-06-04: 1000 mL

## 2017-06-04 MED ORDER — CHLORHEXIDINE GLUCONATE CLOTH 2 % EX PADS: 6.0000 | MEDICATED_PAD | Freq: Once | CUTANEOUS | Status: DC

## 2017-06-04 MED ORDER — IOPAMIDOL (ISOVUE-300) INJECTION 61%
INTRAVENOUS | Status: AC
  Administered 2017-06-04: 100 mL
  Filled 2017-06-04: qty 100

## 2017-06-04 MED ORDER — CEFOTETAN DISODIUM-DEXTROSE 2-2.08 GM-% IV SOLR
2.0000 g | INTRAVENOUS | Status: AC
  Administered 2017-06-04: 2 g via INTRAVENOUS
  Filled 2017-06-04: qty 50

## 2017-06-04 MED ORDER — ONDANSETRON 4 MG PO TBDP: 4.0000 mg | ORAL_TABLET | Freq: Four times a day (QID) | ORAL | Status: DC | PRN

## 2017-06-04 MED ORDER — ONDANSETRON HCL 4 MG/2ML IJ SOLN
4.0000 mg | Freq: Four times a day (QID) | INTRAMUSCULAR | Status: DC | PRN
  Administered 2017-06-04: 4 mg via INTRAVENOUS
  Filled 2017-06-04: qty 2

## 2017-06-04 MED ORDER — HYDROMORPHONE HCL 1 MG/ML IJ SOLN
1.0000 mg | Freq: Once | INTRAMUSCULAR | Status: AC
  Administered 2017-06-04: 1 mg via INTRAVENOUS
  Filled 2017-06-04: qty 1

## 2017-06-04 MED ORDER — FENTANYL CITRATE (PF) 100 MCG/2ML IJ SOLN
25.0000 ug | Freq: Once | INTRAMUSCULAR | Status: AC
  Administered 2017-06-04: 25 ug via INTRAVENOUS
  Filled 2017-06-04: qty 2

## 2017-06-04 MED ORDER — SCOPOLAMINE 1 MG/3DAYS TD PT72
MEDICATED_PATCH | TRANSDERMAL | Status: DC | PRN
  Administered 2017-06-04: 1 via TRANSDERMAL

## 2017-06-04 MED ORDER — OXYCODONE HCL 5 MG/5ML PO SOLN: 5.0000 mg | Freq: Once | ORAL | Status: DC | PRN

## 2017-06-04 MED ORDER — FENTANYL CITRATE (PF) 100 MCG/2ML IJ SOLN
INTRAMUSCULAR | Status: DC | PRN
  Administered 2017-06-04 (×3): 50 ug via INTRAVENOUS
  Administered 2017-06-04: 100 ug via INTRAVENOUS

## 2017-06-04 MED ORDER — MIDAZOLAM HCL 2 MG/2ML IJ SOLN
INTRAMUSCULAR | Status: AC
  Filled 2017-06-04: qty 2

## 2017-06-04 MED ORDER — NEOSTIGMINE METHYLSULFATE 10 MG/10ML IV SOLN
INTRAVENOUS | Status: DC | PRN
  Administered 2017-06-04: 4 mg via INTRAVENOUS

## 2017-06-04 MED ORDER — HYDROMORPHONE HCL 1 MG/ML IJ SOLN: 0.2500 mg | INTRAMUSCULAR | Status: DC | PRN

## 2017-06-04 MED ORDER — MIDAZOLAM HCL 5 MG/5ML IJ SOLN
INTRAMUSCULAR | Status: DC | PRN
  Administered 2017-06-04: 2 mg via INTRAVENOUS

## 2017-06-04 MED ORDER — ENOXAPARIN SODIUM 40 MG/0.4ML ~~LOC~~ SOLN: 40.0000 mg | SUBCUTANEOUS | Status: DC

## 2017-06-04 MED ORDER — PROPOFOL 10 MG/ML IV BOLUS
INTRAVENOUS | Status: AC
  Filled 2017-06-04: qty 20

## 2017-06-04 MED ORDER — BUPIVACAINE-EPINEPHRINE 0.5% -1:200000 IJ SOLN
INTRAMUSCULAR | Status: DC | PRN
  Administered 2017-06-04: 10 mL

## 2017-06-04 MED ORDER — KCL IN DEXTROSE-NACL 20-5-0.9 MEQ/L-%-% IV SOLN
INTRAVENOUS | Status: DC
  Filled 2017-06-04: qty 1000

## 2017-06-04 MED ORDER — PROMETHAZINE HCL 25 MG/ML IJ SOLN
12.5000 mg | Freq: Four times a day (QID) | INTRAMUSCULAR | Status: DC | PRN
  Administered 2017-06-04: 12.5 mg via INTRAVENOUS
  Filled 2017-06-04: qty 1

## 2017-06-04 MED ORDER — OXYCODONE HCL 5 MG PO TABS: 5.0000 mg | ORAL_TABLET | Freq: Once | ORAL | Status: DC | PRN

## 2017-06-04 MED ORDER — SUCCINYLCHOLINE CHLORIDE 20 MG/ML IJ SOLN
INTRAMUSCULAR | Status: DC | PRN
  Administered 2017-06-04: 100 mg via INTRAVENOUS

## 2017-06-04 MED ORDER — PNEUMOCOCCAL VAC POLYVALENT 25 MCG/0.5ML IJ INJ
0.5000 mL | INJECTION | INTRAMUSCULAR | Status: AC
  Administered 2017-06-05: 0.5 mL via INTRAMUSCULAR
  Filled 2017-06-04: qty 0.5

## 2017-06-04 MED ORDER — DEXTROSE 5 % IV SOLN
2.0000 g | Freq: Two times a day (BID) | INTRAVENOUS | Status: AC
  Administered 2017-06-04: 2 g via INTRAVENOUS
  Filled 2017-06-04: qty 2

## 2017-06-04 MED ORDER — HYDROMORPHONE HCL 1 MG/ML IJ SOLN
1.0000 mg | INTRAMUSCULAR | Status: DC | PRN
Start: 2017-06-04 — End: 2017-06-05
  Administered 2017-06-04 – 2017-06-05 (×4): 1 mg via INTRAVENOUS
  Filled 2017-06-04 (×4): qty 1

## 2017-06-04 MED ORDER — PROMETHAZINE HCL 25 MG/ML IJ SOLN: 6.2500 mg | INTRAMUSCULAR | Status: DC | PRN

## 2017-06-04 MED ORDER — GLYCOPYRROLATE 0.2 MG/ML IJ SOLN
INTRAMUSCULAR | Status: DC | PRN
  Administered 2017-06-04: 0.6 mg via INTRAVENOUS

## 2017-06-04 MED ORDER — LIDOCAINE HCL (CARDIAC) 20 MG/ML IV SOLN
INTRAVENOUS | Status: DC | PRN
  Administered 2017-06-04: 60 mg via INTRAVENOUS

## 2017-06-04 MED ORDER — ENOXAPARIN SODIUM 40 MG/0.4ML ~~LOC~~ SOLN
40.0000 mg | SUBCUTANEOUS | Status: DC
  Administered 2017-06-05: 40 mg via SUBCUTANEOUS
  Filled 2017-06-04: qty 0.4

## 2017-06-04 MED ORDER — HYDROMORPHONE HCL 1 MG/ML IJ SOLN: 1.0000 mg | INTRAMUSCULAR | Status: DC | PRN

## 2017-06-04 MED ORDER — DEXAMETHASONE SODIUM PHOSPHATE 4 MG/ML IJ SOLN
INTRAMUSCULAR | Status: DC | PRN
  Administered 2017-06-04: 10 mg via INTRAVENOUS

## 2017-06-04 MED ORDER — SODIUM CHLORIDE 0.9 % IV BOLUS (SEPSIS)
1000.0000 mL | Freq: Once | INTRAVENOUS | Status: AC
  Administered 2017-06-04: 1000 mL via INTRAVENOUS

## 2017-06-04 MED ORDER — DIPHENHYDRAMINE HCL 50 MG/ML IJ SOLN
INTRAMUSCULAR | Status: DC | PRN
  Administered 2017-06-04: 12.5 mg via INTRAVENOUS

## 2017-06-04 MED ORDER — DEXTROSE-NACL 5-0.9 % IV SOLN
INTRAVENOUS | Status: DC
  Administered 2017-06-05: 05:00:00 via INTRAVENOUS

## 2017-06-04 MED ORDER — ROCURONIUM BROMIDE 100 MG/10ML IV SOLN
INTRAVENOUS | Status: DC | PRN
  Administered 2017-06-04: 30 mg via INTRAVENOUS

## 2017-06-04 SURGICAL SUPPLY — 41 items
APPLIER CLIP ROT 10 11.4 M/L (STAPLE)
BLADE CLIPPER SURG (BLADE) IMPLANT
CANISTER SUCT 3000ML PPV (MISCELLANEOUS) ×3 IMPLANT
CHLORAPREP W/TINT 26ML (MISCELLANEOUS) ×3 IMPLANT
CLIP APPLIE ROT 10 11.4 M/L (STAPLE) IMPLANT
COVER SURGICAL LIGHT HANDLE (MISCELLANEOUS) ×3 IMPLANT
CUTTER FLEX LINEAR 45M (STAPLE) ×3 IMPLANT
DERMABOND ADHESIVE PROPEN (GAUZE/BANDAGES/DRESSINGS) ×2
DERMABOND ADVANCED (GAUZE/BANDAGES/DRESSINGS) ×2
DERMABOND ADVANCED .7 DNX12 (GAUZE/BANDAGES/DRESSINGS) ×1 IMPLANT
DERMABOND ADVANCED .7 DNX6 (GAUZE/BANDAGES/DRESSINGS) ×1 IMPLANT
DRAPE WARM FLUID 44X44 (DRAPE) ×3 IMPLANT
ELECT REM PT RETURN 9FT ADLT (ELECTROSURGICAL) ×3
ELECTRODE REM PT RTRN 9FT ADLT (ELECTROSURGICAL) ×1 IMPLANT
ENDOLOOP SUT PDS II  0 18 (SUTURE)
ENDOLOOP SUT PDS II 0 18 (SUTURE) IMPLANT
GLOVE BIO SURGEON STRL SZ8 (GLOVE) ×3 IMPLANT
GLOVE BIOGEL PI IND STRL 8 (GLOVE) ×1 IMPLANT
GLOVE BIOGEL PI INDICATOR 8 (GLOVE) ×2
GOWN STRL REUS W/ TWL LRG LVL3 (GOWN DISPOSABLE) ×2 IMPLANT
GOWN STRL REUS W/ TWL XL LVL3 (GOWN DISPOSABLE) ×1 IMPLANT
GOWN STRL REUS W/TWL LRG LVL3 (GOWN DISPOSABLE) ×4
GOWN STRL REUS W/TWL XL LVL3 (GOWN DISPOSABLE) ×2
KIT BASIN OR (CUSTOM PROCEDURE TRAY) ×3 IMPLANT
KIT ROOM TURNOVER OR (KITS) ×3 IMPLANT
NS IRRIG 1000ML POUR BTL (IV SOLUTION) ×3 IMPLANT
PAD ARMBOARD 7.5X6 YLW CONV (MISCELLANEOUS) ×6 IMPLANT
POUCH SPECIMEN RETRIEVAL 10MM (ENDOMECHANICALS) ×3 IMPLANT
RELOAD STAPLE TA45 3.5 REG BLU (ENDOMECHANICALS) ×3 IMPLANT
SCISSORS LAP 5X35 DISP (ENDOMECHANICALS) ×3 IMPLANT
SET IRRIG TUBING LAPAROSCOPIC (IRRIGATION / IRRIGATOR) ×3 IMPLANT
SHEARS HARMONIC ACE PLUS 36CM (ENDOMECHANICALS) ×3 IMPLANT
SPECIMEN JAR SMALL (MISCELLANEOUS) ×3 IMPLANT
SUT MON AB 4-0 PC3 18 (SUTURE) ×3 IMPLANT
TOWEL OR 17X24 6PK STRL BLUE (TOWEL DISPOSABLE) ×3 IMPLANT
TOWEL OR 17X26 10 PK STRL BLUE (TOWEL DISPOSABLE) ×3 IMPLANT
TRAY FOLEY CATH SILVER 16FR (SET/KITS/TRAYS/PACK) ×3 IMPLANT
TRAY LAPAROSCOPIC MC (CUSTOM PROCEDURE TRAY) ×3 IMPLANT
TROCAR XCEL BLADELESS 5X75MML (TROCAR) ×6 IMPLANT
TROCAR XCEL BLUNT TIP 100MML (ENDOMECHANICALS) ×3 IMPLANT
TUBING INSUFFLATION (TUBING) ×3 IMPLANT

## 2017-06-04 NOTE — Op Note (Signed)
Appendectomy, Lap, Procedure Note  Indications: The patient presented with a history of right-sided abdominal pain. A CT revealed findings consistent with acute appendicitis. The procedure has been discussed with the patient.  Alternative therapies have been discussed with the patient.  Operative risks include bleeding,  Infection,  Organ injury,  Nerve injury,  Blood vessel injury,  DVT,  Pulmonary embolism,  Death,  And possible reoperation.  Medical management risks include worsening of present situation.  The success of the procedure is 50 -100 % at treating patients symptoms.  The patient understands and agrees to proceed.  Pre-operative Diagnosis: Acute appendicitis without mention of peritonitis  Post-operative Diagnosis: Same  Surgeon: Bulmaro Feagans A.   Assistants: OR STAFF  Anesthesia: General endotracheal anesthesia and Local anesthesia 0.25.% bupivacaine, with epinephrine  ASA Class: 1  Procedure Details  The patient was seen again in the Holding Room. The risks, benefits, complications, treatment options, and expected outcomes were discussed with the patient and/or family. The possibilities of reaction to medication, pulmonary aspiration, perforation of viscus, bleeding, recurrent infection, finding a normal appendix, the need for additional procedures, failure to diagnose a condition, and creating a complication requiring transfusion or operation were discussed. There was concurrence with the proposed plan and informed consent was obtained. The site of surgery was properly noted/marked. The patient was taken to Operating Room, identified as Meghan Riggs and the procedure verified as Appendectomy. A Time Out was held and the above information confirmed.  The patient was placed in the supine position and general anesthesia was induced, along with placement of orogastric tube, Venodyne boots, and a Foley catheter. The abdomen was prepped and draped in a sterile fashion. A one  centimeter infraumbilical incision was made and the peritoneal cavity was accessed using the OPEN  technique. The pneumoperitoneum was then established to steady pressure of 12 mmHg. A 12 mm port was placed through the umbilical incision. Additional 5 mm cannulas then placed in the  lower MIDLINE of the abdomen and half way between the umbilicus and xyphoid  under direct vision. A careful evaluation of the entire abdomen was carried out. The patient was placed in Trendelenburg and left lateral decubitus position. The small intestines were retracted in the cephalad and left lateral direction away from the pelvis and right lower quadrant. The patient was found to have an enlarged and inflamed appendix that was extending into the pelvis. There was no evidence of perforation.  The appendix was carefully dissected. A window was made in the mesoappendix at the base of the appendix. A harmonic scalpel was used across the mesoappendix. The appendix was divided at its base using an endo-GIA stapler. Minimal appendiceal stump was left in place. There was no evidence of bleeding, leakage, or complication after division of the appendix. Irrigation was also performed and irrigate suctioned from the abdomen as well.  The umbilical port site was closed using 0 vicryl pursestring sutures fashion at the level of the fascia. The trocar site skin wounds were closed using skin staples.  Instrument, sponge, and needle counts were correct at the conclusion of the case.   Findings: The appendix was found to be inflamed. There were not signs of necrosis.  There was not perforation. There was not abscess formation.  Estimated Blood Loss:  less than 50 mL         Drains: NONE         Total IV Fluids: PER ANESTHESIA          Specimens:  APPENDIX         Complications:  None; patient tolerated the procedure well.         Disposition: PACU - hemodynamically stable.         Condition: stable

## 2017-06-04 NOTE — Anesthesia Postprocedure Evaluation (Signed)
Anesthesia Post Note  Patient: Meghan Riggs  Procedure(s) Performed: Procedure(s) (LRB): APPENDECTOMY LAPAROSCOPIC (N/A)     Patient location during evaluation: PACU Anesthesia Type: General Level of consciousness: awake and alert Pain management: pain level controlled Vital Signs Assessment: post-procedure vital signs reviewed and stable Respiratory status: spontaneous breathing, nonlabored ventilation, respiratory function stable and patient connected to nasal cannula oxygen Cardiovascular status: blood pressure returned to baseline and stable Postop Assessment: no signs of nausea or vomiting Anesthetic complications: no    Last Vitals:  Vitals:   06/04/17 1709 06/04/17 1730  BP:  118/69  Pulse: 64 75  Resp: 16 18  Temp: (!) 36.3 C 36.6 C  SpO2: 95% 96%    Last Pain:  Vitals:   06/04/17 1739  TempSrc:   PainSc: 9                  Preslei Blakley P Duwayne Matters

## 2017-06-04 NOTE — ED Notes (Addendum)
[facility] [phone], [phone]

## 2017-06-04 NOTE — Anesthesia Procedure Notes (Signed)
Procedure Name: Intubation Date/Time: 06/04/2017 3:12 PM Performed by: Clearnce Sorrel Pre-anesthesia Checklist: Patient identified, Emergency Drugs available, Suction available, Patient being monitored and Timeout performed Patient Re-evaluated:Patient Re-evaluated prior to induction Oxygen Delivery Method: Circle system utilized Preoxygenation: Pre-oxygenation with 100% oxygen Induction Type: IV induction Ventilation: Mask ventilation without difficulty Laryngoscope Size: Mac and 3 Grade View: Grade I Tube type: Oral Tube size: 7.0 mm Number of attempts: 1 Airway Equipment and Method: Stylet Placement Confirmation: ETT inserted through vocal cords under direct vision,  positive ETCO2 and breath sounds checked- equal and bilateral Secured at: 22 cm Tube secured with: Tape Dental Injury: Teeth and Oropharynx as per pre-operative assessment

## 2017-06-04 NOTE — ED Notes (Signed)
Two rings, cell phone, clothing given to fiance, [place]

## 2017-06-04 NOTE — H&P (Signed)
Meghan Riggs is an 21 y.o. female.   Chief Complaint: Abdominal pain HPI: Patient seen at the request of Dr. Vanita Panda for 1 day history of lower abdominal pain. The pain started earlier this morning at about 2 AM. Progressed throughout the morning is now located in her right lower quadrant route. She also has some nausea but no vomiting. The pain is made worse with movement and made better with rest. CT scan shows an inflamed appendix without perforation consistent with acute appendicitis.  Past Medical History:  Diagnosis Date  . Asthma    Dr. Donneta Romberg  . Club foot 1996   Congenital  . Hyperlipidemia     Past Surgical History:  Procedure Laterality Date  . CLUB FOOT RELEASE  7133   @ 78 days old  . KNEE SURGERY      Family History  Problem Relation Age of Onset  . Diabetes Father   . Diabetes Paternal Uncle   . Diabetes Paternal Grandmother    Social History:  reports that she has never smoked. She has never used smokeless tobacco. She reports that she does not drink alcohol or use drugs.  Allergies: No Known Allergies  Medications Prior to Admission  Medication Sig Dispense Refill  . aspirin EC 81 MG tablet Take 243 mg by mouth every 6 (six) hours as needed for moderate pain.    Marland Kitchen etonogestrel-ethinyl estradiol (NUVARING) 0.12-0.015 MG/24HR vaginal ring Place 1 each vaginally See admin instructions. Insert vaginally and leave in place for 24 days then puts a new one in on the same day for 24 days and then takes out for four days.    Marland Kitchen ibuprofen (ADVIL,MOTRIN) 200 MG tablet Take 400 mg by mouth daily as needed for headache or moderate pain.     Marland Kitchen omeprazole (PRILOSEC) 20 MG capsule Take 20 mg by mouth 2 (two) times daily as needed (For heartburn or acid reflux.).     Marland Kitchen ranitidine (ZANTAC) 75 MG tablet Take 75 mg by mouth 2 (two) times daily as needed for heartburn.    Marland Kitchen acetaminophen (TYLENOL) 500 MG tablet Take 1 tablet (500 mg total) by mouth every 6 (six) hours as needed.  (Patient not taking: Reported on 06/04/2017) 30 tablet 0  . cyclobenzaprine (FLEXERIL) 10 MG tablet Take 1 tablet (10 mg total) by mouth 2 (two) times daily as needed for muscle spasms. (Patient not taking: Reported on 06/04/2017) 20 tablet 0  . ondansetron (ZOFRAN) 8 MG tablet Take 1 tablet (8 mg total) by mouth every 8 (eight) hours as needed for nausea or vomiting. (Patient not taking: Reported on 06/04/2017) 10 tablet 0  . triamcinolone cream (KENALOG) 0.1 % Apply 1 application topically 2 (two) times daily. Apply to the area (Patient not taking: Reported on 06/04/2017) 30 g 0    Results for orders placed or performed during the hospital encounter of 06/04/17 (from the past 48 hour(s))  Lipase, blood     Status: None   Collection Time: 06/04/17 10:14 AM  Result Value Ref Range   Lipase 28 11 - 51 U/L  Comprehensive metabolic panel     Status: Abnormal   Collection Time: 06/04/17 10:14 AM  Result Value Ref Range   Sodium 133 (L) 135 - 145 mmol/L   Potassium 3.9 3.5 - 5.1 mmol/L   Chloride 102 101 - 111 mmol/L   CO2 22 22 - 32 mmol/L   Glucose, Bld 81 65 - 99 mg/dL   BUN 5 (L) 6 -  20 mg/dL   Creatinine, Ser 0.66 0.44 - 1.00 mg/dL   Calcium 9.1 8.9 - 10.3 mg/dL   Total Protein 6.8 6.5 - 8.1 g/dL   Albumin 3.7 3.5 - 5.0 g/dL   AST 55 (H) 15 - 41 U/L   ALT 52 14 - 54 U/L   Alkaline Phosphatase 59 38 - 126 U/L   Total Bilirubin 0.6 0.3 - 1.2 mg/dL   GFR calc non Af Amer >60 >60 mL/min   GFR calc Af Amer >60 >60 mL/min    Comment: (NOTE) The eGFR has been calculated using the CKD EPI equation. This calculation has not been validated in all clinical situations. eGFR's persistently <60 mL/min signify possible Chronic Kidney Disease.    Anion gap 9 5 - 15  CBC     Status: Abnormal   Collection Time: 06/04/17 10:14 AM  Result Value Ref Range   WBC 16.4 (H) 4.0 - 10.5 K/uL   RBC 4.52 3.87 - 5.11 MIL/uL   Hemoglobin 13.7 12.0 - 15.0 g/dL   HCT 40.4 36.0 - 46.0 %   MCV 89.4 78.0 - 100.0  fL   MCH 30.3 26.0 - 34.0 pg   MCHC 33.9 30.0 - 36.0 g/dL   RDW 12.2 11.5 - 15.5 %   Platelets 286 150 - 400 K/uL  Urinalysis, Routine w reflex microscopic     Status: None   Collection Time: 06/04/17 10:14 AM  Result Value Ref Range   Color, Urine YELLOW YELLOW   APPearance CLEAR CLEAR   Specific Gravity, Urine 1.010 1.005 - 1.030   pH 6.0 5.0 - 8.0   Glucose, UA NEGATIVE NEGATIVE mg/dL   Hgb urine dipstick NEGATIVE NEGATIVE   Bilirubin Urine NEGATIVE NEGATIVE   Ketones, ur NEGATIVE NEGATIVE mg/dL   Protein, ur NEGATIVE NEGATIVE mg/dL   Nitrite NEGATIVE NEGATIVE   Leukocytes, UA NEGATIVE NEGATIVE  POC Urine Pregnancy, ED (do NOT order at [facility])     Status: None   Collection Time: 06/04/17 10:58 AM  Result Value Ref Range   Preg Test, Ur NEGATIVE NEGATIVE    Comment:        THE SENSITIVITY OF THIS METHODOLOGY IS >24 mIU/mL    Ct Abdomen Pelvis W Contrast  Result Date: 06/04/2017 CLINICAL DATA:  Lower abdominal pain, nausea EXAM: CT ABDOMEN AND PELVIS WITH CONTRAST TECHNIQUE: Multidetector CT imaging of the abdomen and pelvis was performed using the standard protocol following bolus administration of intravenous contrast. CONTRAST:  151m ISOVUE-300 IOPAMIDOL (ISOVUE-300) INJECTION 61% COMPARISON:  05/29/2014 FINDINGS: Lower chest: Lung bases are clear. Hepatobiliary: Hepatic steatosis focal fatty sparing along the gallbladder fossa. Gallbladder is unremarkable. No intrahepatic extrahepatic ductal dilatation. Pancreas: Within normal limits. Spleen: Within normal limits. Adrenals/Urinary Tract: Adrenal glands are within normal limits. Kidneys are within normal limits.  No hydronephrosis. Bladder is within normal limits. Stomach/Bowel: Stomach is within normal limits. No evidence of bowel obstruction. Appendix is normal in size (series 3/image 60), but there is mild periappendiceal stranding (coronal image 70). Vascular/Lymphatic: No evidence of abdominal aortic aneurysm. Scattered small  mesenteric lymph nodes which do not meet pathologic CT size criteria. Reproductive: Uterus is within normal limits. Bilateral ovaries are within normal limits. Other: Trace pelvic ascites, possibly physiologic. No drainable fluid collection/abscess.  No free air. Musculoskeletal: Visualized osseous structures are within normal limits. IMPRESSION: Appendix is normal in size but notable for mild periappendiceal stranding. In the appropriate clinical setting, very early acute appendicitis could have this appearance. Consider surgical evaluation  as clinically warranted. No drainable fluid collection/abscess.  No free air. Trace pelvic ascites. These results were called by telephone at the time of interpretation on 06/04/2017 at 12:13 pm to Dr. Carmin Muskrat , who verbally acknowledged these results. Electronically Signed   By: Julian Hy M.D.   On: 06/04/2017 12:13    Review of Systems  Constitutional: Positive for malaise/fatigue.  HENT: Positive for hearing loss.   Eyes: Negative for blurred vision.  Respiratory: Negative for cough.   Cardiovascular: Negative for chest pain.  Gastrointestinal: Positive for abdominal pain, nausea and vomiting.  Musculoskeletal: Negative for neck pain.  Skin: Negative for itching and rash.  Neurological: Positive for dizziness.  Endo/Heme/Allergies: Does not bruise/bleed easily.  Psychiatric/Behavioral: Negative for depression.    Blood pressure 114/70, pulse 79, temperature 98.5 F (36.9 C), temperature source Oral, resp. rate 16, height 5' (1.524 m), weight 97.2 kg (214 lb 4 oz), last menstrual period 05/30/2017, SpO2 95 %. Physical Exam  Constitutional: She appears well-developed and well-nourished.  HENT:  Head: Normocephalic and atraumatic.  Eyes: Pupils are equal, round, and reactive to light.  Neck: Normal range of motion.  Cardiovascular: Normal rate and regular rhythm.   Respiratory: Effort normal and breath sounds normal.  GI: Soft. She  exhibits no distension. There is tenderness in the right lower quadrant. There is tenderness at McBurney's point.  Musculoskeletal: Normal range of motion.  Neurological: She is alert.  Skin: Skin is warm and dry.  Psychiatric: She has a normal mood and affect. Her behavior is normal. Thought content normal.     Assessment/Plan Acute appendicitis  Discussed options of appendectomy versus medical management. The pros and cons of each were discussed extensively. The patient opted for laparoscopic appendectomy. The procedure, complications, and postoperative course were discussed with the patient.The procedure has been discussed with the patient.  Alternative therapies have been discussed with the patient.  Operative risks include bleeding,  Infection,  Organ injury,  Nerve injury,  Blood vessel injury,  DVT,  Pulmonary embolism,  Death,  And possible reoperation.  Medical management risks include worsening of present situation.  The success of the procedure is 50 -90 % at treating patients symptoms.  The patient understands and agrees to proceed.  Mamie Hundertmark A., MD 06/04/2017, 2:41 PM

## 2017-06-04 NOTE — Transfer of Care (Signed)
Immediate Anesthesia Transfer of Care Note  Patient: Meghan Riggs  Procedure(s) Performed: Procedure(s): APPENDECTOMY LAPAROSCOPIC (N/A)  Patient Location: PACU  Anesthesia Type:General  Level of Consciousness: awake, alert  and oriented  Airway & Oxygen Therapy: Patient Spontanous Breathing and Patient connected to nasal cannula oxygen  Post-op Assessment: Report given to RN and Post -op Vital signs reviewed and stable  Post vital signs: Reviewed and stable  Last Vitals:  Vitals:   06/04/17 1300 06/04/17 1629  BP: 114/70   Pulse: 79 63  Resp: 16 20  Temp:    SpO2: 95% 94%    Last Pain:  Vitals:   06/04/17 1306  TempSrc:   PainSc: 5          Complications: No apparent anesthesia complications

## 2017-06-04 NOTE — ED Provider Notes (Signed)
[facility] DEPT Provider Note   CSN: [id number] Arrival date & time: 06/04/17  0920     History   Chief Complaint Chief Complaint  Patient presents with  . Abdominal Pain    HPI Meghan Riggs is a 21 y.o. female.  HPI Patient presents with right lower quadrant abdominal pain. Onset was 7 hours ago, sudden. Since onset pain is focally in the right lower quadrant, sore, severe with associated needed to defecate, but with no substantial stool production beyond baseline. There is associated nausea, generalized discomfort, no fever. Patient has no history of abdominal surgery, states that she is generally well aside from asthma.  No medication taken for pain relief.  Past Medical History:  Diagnosis Date  . Asthma    Dr. Donneta Romberg  . Club foot 1996   Congenital  . Hyperlipidemia     There are no active problems to display for this patient.   Past Surgical History:  Procedure Laterality Date  . CLUB FOOT RELEASE  7442   @ 55 days old  . KNEE SURGERY      OB History    No data available       Home Medications    Prior to Admission medications   Medication Sig Start Date End Date Taking? Authorizing Provider  aspirin EC 81 MG tablet Take 243 mg by mouth every 6 (six) hours as needed for moderate pain.   Yes [provider]  etonogestrel-ethinyl estradiol (NUVARING) 0.12-0.015 MG/24HR vaginal ring Place 1 each vaginally See admin instructions. Insert vaginally and leave in place for 24 days then puts a new one in on the same day for 24 days and then takes out for four days.   Yes [provider]  ibuprofen (ADVIL,MOTRIN) 200 MG tablet Take 400 mg by mouth daily as needed for headache or moderate pain.    Yes [provider]  omeprazole (PRILOSEC) 20 MG capsule Take 20 mg by mouth 2 (two) times daily as needed (For heartburn or acid reflux.).    Yes [provider]  ranitidine (ZANTAC) 75 MG tablet Take 75 mg by mouth 2 (two)  times daily as needed for heartburn.   Yes [provider]  acetaminophen (TYLENOL) 500 MG tablet Take 1 tablet (500 mg total) by mouth every 6 (six) hours as needed. Patient not taking: Reported on 06/04/2017 11/14/16   Mackuen, Fredia Sorrow, MD  cyclobenzaprine (FLEXERIL) 10 MG tablet Take 1 tablet (10 mg total) by mouth 2 (two) times daily as needed for muscle spasms. Patient not taking: Reported on 06/04/2017 10/26/15   Hedges, Dellis Filbert, PA-C  ondansetron (ZOFRAN) 8 MG tablet Take 1 tablet (8 mg total) by mouth every 8 (eight) hours as needed for nausea or vomiting. Patient not taking: Reported on 06/04/2017 05/29/14   Street, Elwood, PA-C  triamcinolone cream (KENALOG) 0.1 % Apply 1 application topically 2 (two) times daily. Apply to the area Patient not taking: Reported on 06/04/2017 01/04/17   Doristine Devoid, PA-C    Family History Family History  Problem Relation Age of Onset  . Diabetes Father   . Diabetes Paternal Uncle   . Diabetes Paternal Grandmother     Social History Social History  Substance Use Topics  . Smoking status: Never Smoker  . Smokeless tobacco: Never Used  . Alcohol use No     Allergies   Patient has no known allergies.   Review of Systems Review of Systems  Constitutional:  Per HPI, otherwise negative  HENT:       Per HPI, otherwise negative  Respiratory:       Per HPI, otherwise negative  Cardiovascular:       Per HPI, otherwise negative  Gastrointestinal: Positive for abdominal pain and nausea. Negative for vomiting.  Endocrine:       Negative aside from HPI  Genitourinary:       No urinary changes no history of kidney disease, kidney stone  Musculoskeletal:       Per HPI, otherwise negative  Skin: Negative.   Neurological: Negative for syncope.     Physical Exam Updated Vital Signs BP 122/84 (BP Location: Left Arm)   Pulse 99   Temp 98.5 F (36.9 C) (Oral)   Resp 17   Ht 5' (1.524 m)   Wt 97.2 kg (214 lb 4 oz)    LMP 05/30/2017   SpO2 96%   BMI 41.84 kg/m   Physical Exam  Constitutional: She is oriented to person, place, and time. She appears well-developed and well-nourished. No distress.  obese young female, uncomfortable appearing  HENT:  Head: Normocephalic and atraumatic.  Eyes: Conjunctivae and EOM are normal.  Cardiovascular: Normal rate and regular rhythm.   Pulmonary/Chest: Effort normal and breath sounds normal. No stridor. No respiratory distress.  Abdominal: She exhibits no distension. There is guarding and positive Murphy's sign.  Beyond pain at right lower quadrant she has referred pain in this area with palpation of the remaining abdominal area  Musculoskeletal: She exhibits no edema.  Neurological: She is alert and oriented to person, place, and time. No cranial nerve deficit.  Skin: Skin is warm and dry.  Psychiatric: She has a normal mood and affect.  Nursing note and vitals reviewed.    ED Treatments / Results  Labs (all labs ordered are listed, but only abnormal results are displayed) Labs Reviewed  COMPREHENSIVE METABOLIC PANEL - Abnormal; Notable for the following:       Result Value   Sodium 133 (*)    BUN 5 (*)    AST 55 (*)    All other components within normal limits  CBC - Abnormal; Notable for the following:    WBC 16.4 (*)    All other components within normal limits  LIPASE, BLOOD  URINALYSIS, ROUTINE W REFLEX MICROSCOPIC  POC URINE PREG, ED     Radiology Ct Abdomen Pelvis W Contrast  Result Date: 06/04/2017 CLINICAL DATA:  Lower abdominal pain, nausea EXAM: CT ABDOMEN AND PELVIS WITH CONTRAST TECHNIQUE: Multidetector CT imaging of the abdomen and pelvis was performed using the standard protocol following bolus administration of intravenous contrast. CONTRAST:  161mL ISOVUE-300 IOPAMIDOL (ISOVUE-300) INJECTION 61% COMPARISON:  05/29/2014 FINDINGS: Lower chest: Lung bases are clear. Hepatobiliary: Hepatic steatosis focal fatty sparing along the  gallbladder fossa. Gallbladder is unremarkable. No intrahepatic extrahepatic ductal dilatation. Pancreas: Within normal limits. Spleen: Within normal limits. Adrenals/Urinary Tract: Adrenal glands are within normal limits. Kidneys are within normal limits.  No hydronephrosis. Bladder is within normal limits. Stomach/Bowel: Stomach is within normal limits. No evidence of bowel obstruction. Appendix is normal in size (series 3/image 60), but there is mild periappendiceal stranding (coronal image 70). Vascular/Lymphatic: No evidence of abdominal aortic aneurysm. Scattered small mesenteric lymph nodes which do not meet pathologic CT size criteria. Reproductive: Uterus is within normal limits. Bilateral ovaries are within normal limits. Other: Trace pelvic ascites, possibly physiologic. No drainable fluid collection/abscess.  No free air. Musculoskeletal: Visualized osseous structures are within  normal limits. IMPRESSION: Appendix is normal in size but notable for mild periappendiceal stranding. In the appropriate clinical setting, very early acute appendicitis could have this appearance. Consider surgical evaluation as clinically warranted. No drainable fluid collection/abscess.  No free air. Trace pelvic ascites. These results were called by telephone at the time of interpretation on 06/04/2017 at 12:13 pm to Dr. Carmin Muskrat , who verbally acknowledged these results. Electronically Signed   By: Julian Hy M.D.   On: 06/04/2017 12:13    Procedures Procedures (including critical care time)  Medications Ordered in ED Medications  fentaNYL (SUBLIMAZE) injection 25 mcg (25 mcg Intravenous Given by Other 06/04/17 1116)  ondansetron (ZOFRAN) injection 4 mg (4 mg Intravenous Given 06/04/17 1116)  sodium chloride 0.9 % bolus 1,000 mL (1,000 mLs Intravenous New Bag/Given 06/04/17 1116)  iopamidol (ISOVUE-300) 61 % injection (100 mLs  Contrast Given 06/04/17 1133)     Initial Impression / Assessment and Plan  / ED Course  I have reviewed the triage vital signs and the nursing notes.  Pertinent labs & imaging results that were available during my care of the patient were reviewed by me and considered in my medical decision making (see chart for details).  And female presents with sudden onset right lower quadrant abdominal pain. Given the patient's physical exam findings, leukocytosis, suggestive CT scan, there is concern for acute appendicitis. I discussed patient's case with our surgical colleagues for admission, further evaluation and management.  Final Clinical Impressions(s) / ED Diagnoses   Final diagnoses:  Acute appendicitis with generalized peritonitis      Carmin Muskrat, MD 06/04/17 1335

## 2017-06-04 NOTE — ED Triage Notes (Signed)
Pt. Stated, Ive had rt. sid eabdominal pain since yesterday and it really hurts to go to the bathroom. I feel like I have a knot in my stomach.. Symptoms started yesterday morning.

## 2017-06-04 NOTE — Anesthesia Preprocedure Evaluation (Signed)
Anesthesia Evaluation  Patient identified by MRN, date of birth, ID band Patient awake    Reviewed: Allergy & Precautions, NPO status , Patient's Chart, lab work & pertinent test results  Airway Mallampati: II  TM Distance: >3 FB Neck ROM: Full    Dental no notable dental hx.    Pulmonary asthma ,    Pulmonary exam normal breath sounds clear to auscultation       Cardiovascular negative cardio ROS Normal cardiovascular exam Rhythm:Regular Rate:Normal  ECG: NSR, rate 80   Neuro/Psych negative neurological ROS  negative psych ROS   GI/Hepatic Neg liver ROS, GERD  Medicated,  Endo/Other  Morbid obesity  Renal/GU negative Renal ROS     Musculoskeletal negative musculoskeletal ROS (+)   Abdominal (+) + obese,   Peds  Hematology negative hematology ROS (+)   Anesthesia Other Findings Hyperlipidemia hcg negative  Reproductive/Obstetrics                             Anesthesia Physical Anesthesia Plan  ASA: III and emergent  Anesthesia Plan: General   Post-op Pain Management:    Induction: Intravenous  PONV Risk Score and Plan: 3 and Ondansetron, Dexamethasone and Midazolam  Airway Management Planned: Oral ETT  Additional Equipment:   Intra-op Plan:   Post-operative Plan: Extubation in OR  Informed Consent: I have reviewed the patients History and Physical, chart, labs and discussed the procedure including the risks, benefits and alternatives for the proposed anesthesia with the patient or authorized representative who has indicated his/her understanding and acceptance.   Dental advisory given  Plan Discussed with: CRNA  Anesthesia Plan Comments:         Anesthesia Quick Evaluation

## 2017-06-05 ENCOUNTER — Encounter (HOSPITAL_COMMUNITY): Payer: Self-pay | Admitting: Surgery

## 2017-06-05 LAB — BASIC METABOLIC PANEL
ANION GAP: 11 (ref 5–15)
BUN: <5 mg/dL — ABNORMAL LOW (ref 6–20)
CALCIUM: 9 mg/dL (ref 8.9–10.3)
CHLORIDE: 106 mmol/L (ref 101–111)
CO2: 20 mmol/L — AB (ref 22–32)
Creatinine, Ser: 0.77 mg/dL (ref 0.44–1.00)
GFR calc Af Amer: >60 mL/min (ref >60)
GLUCOSE: 140 mg/dL — AB (ref 65–99)
POTASSIUM: 3.9 mmol/L (ref 3.5–5.1)
Sodium: 137 mmol/L (ref 135–145)

## 2017-06-05 LAB — CBC
HEMATOCRIT: 39.1 % (ref 36.0–46.0)
HEMOGLOBIN: 12.9 g/dL (ref 12.0–15.0)
MCH: 29.5 pg (ref 26.0–34.0)
MCHC: 33 g/dL (ref 30.0–36.0)
MCV: 89.3 fL (ref 78.0–100.0)
PLATELETS: 280 10*3/uL (ref 150–400)
RBC: 4.38 MIL/uL (ref 3.87–5.11)
RDW: 12 % (ref 11.5–15.5)
WBC: 14.5 10*3/uL — AB (ref 4.0–10.5)

## 2017-06-05 LAB — HIV ANTIBODY (ROUTINE TESTING W REFLEX): HIV SCREEN 4TH GENERATION: NONREACTIVE

## 2017-06-05 MED ORDER — ACETAMINOPHEN 500 MG PO TABS
1000.0000 mg | ORAL_TABLET | Freq: Three times a day (TID) | ORAL | Status: DC
  Administered 2017-06-05: 1000 mg via ORAL
  Filled 2017-06-05: qty 2

## 2017-06-05 MED ORDER — KETOROLAC TROMETHAMINE 15 MG/ML IJ SOLN
15.0000 mg | Freq: Four times a day (QID) | INTRAMUSCULAR | Status: DC
  Administered 2017-06-05: 15 mg via INTRAVENOUS
  Filled 2017-06-05: qty 1

## 2017-06-05 MED ORDER — HYDROMORPHONE HCL 1 MG/ML IJ SOLN: 0.5000 mg | INTRAMUSCULAR | Status: DC | PRN

## 2017-06-05 MED ORDER — KETOROLAC TROMETHAMINE 30 MG/ML IJ SOLN: 30.0000 mg | Freq: Once | INTRAMUSCULAR | Status: DC

## 2017-06-05 MED ORDER — ACETAMINOPHEN 500 MG PO TABS
ORAL_TABLET | ORAL | 0 refills | Status: DC
Start: 2017-06-05 — End: 2017-12-28

## 2017-06-05 MED ORDER — IBUPROFEN 600 MG PO TABS: 600.0000 mg | ORAL_TABLET | Freq: Four times a day (QID) | ORAL | Status: DC | PRN

## 2017-06-05 MED ORDER — OXYCODONE HCL 5 MG PO TABS: 5.0000 mg | ORAL_TABLET | ORAL | 0 refills | Status: DC | PRN

## 2017-06-05 MED ORDER — METHOCARBAMOL 750 MG PO TABS: 750.0000 mg | ORAL_TABLET | Freq: Three times a day (TID) | ORAL | 0 refills | Status: DC | PRN

## 2017-06-05 MED ORDER — IBUPROFEN 200 MG PO TABS: ORAL_TABLET | ORAL | Status: DC

## 2017-06-05 NOTE — Progress Notes (Addendum)
CM received consult for medication needs. Pt is without prescription coverage. Pt doesn't qualify for medication assistance, pt with over the counter medication prescribed ( tylenol, Motrin) and one prescription written for oxycodone which  Match Letter will not  cover. Match letter will not cover narcotics. CM spoke and explained to pt. Whitman Hero RN,BSN,CM

## 2017-06-05 NOTE — Discharge Instructions (Signed)
Laparoscopic Appendectomy, Adult, Care After °These instructions give you information about caring for yourself after your procedure. Your doctor may also give you more specific instructions. Call your doctor if you have any problems or questions after your procedure. °Follow these instructions at home: °Medicines °· Take over-the-counter and prescription medicines only as told by your doctor. °· Do not drive for 24 hours if you received a sedative. °· Do not drive or use heavy machinery while taking prescription pain medicine. °· If you were prescribed an antibiotic medicine, take it as told by your doctor. Do not stop taking it even if you start to feel better. °Activity °· Do not lift anything that is heavier than 10 pounds (4.5 kg) for 3 weeks or as told by your doctor. °· Do not play contact sports for 3 weeks or as told by your doctor. °· Slowly return to your normal activities. °Bathing °· Keep your cuts from surgery (incisions) clean and dry. °? Gently wash the cuts with soap and water. °? Rinse the cuts with water until the soap is gone. °? Pat the cuts dry with a clean towel. Do not rub the cuts. °· You may take showers after 48 hours. °· Do not take baths, swim, or use a hot tub for 2 weeks or as told by your doctor. °Cut Care °· Follow instructions from your doctor about how to take care of your cuts. Make sure you: °? Wash your hands with soap and water before you change your bandage (dressing). If you do not have soap and water, use hand sanitizer. °? Change your bandage as told by your doctor. °? Leave stitches (sutures), skin glue, or skin tape (adhesive) strips in place. They may need to stay in place for 2 weeks or longer. If tape strips get loose and curl up, you may trim the loose edges. Do not remove tape strips completely unless your doctor says it is okay. °· Check your cuts every day for signs of infection. Check for: °? More redness, swelling, or pain. °? More fluid or  blood. °? Warmth. °? Pus or a bad smell. °Other Instructions °· If you were sent home with a drain, follow instructions from your doctor about how to use it and care for it. °· Take deep breaths. This helps to keep your lungs from getting swollen (inflamed). °· To help with constipation: °? Drink plenty of fluids. °? Eat plenty of fruits and vegetables. °· Keep all follow-up visits as told by your doctor. This is important. °Contact a doctor if: °· You have more redness, swelling, or pain around a cut from surgery. °· You have more fluid or blood coming from a cut. °· Your cut feels warm to the touch. °· You have pus or a bad smell coming from a cut or a bandage. °· The edges of a cut break open after the stitches have been taken out. °· You have pain in your shoulders that gets worse. °· You feel dizzy or you pass out (faint). °· You have shortness of breath. °· You keep feeling sick to your stomach (nauseous). °· You keep throwing up (vomiting). °· You get diarrhea or you cannot control your poop. °· You lose your appetite. °· You have swelling or pain in your legs. °Get help right away if: °· You have a fever. °· You get a rash. °· You have trouble breathing. °· You have sharp pains in your chest. °This information is not intended to replace advice given   to you by your health care provider. Make sure you discuss any questions you have with your health care provider. °Document Released: 07/23/2009 Document Revised: 03/03/2016 Document Reviewed: 03/16/2015 °Elsevier Interactive Patient Education © 2018 [employer] ° °CCS ______[facility] °LAPAROSCOPIC SURGERY: POST OP INSTRUCTIONS °Always review your discharge instruction sheet given to you by the facility where your surgery was performed. °IF YOU HAVE DISABILITY OR FAMILY LEAVE FORMS, YOU MUST BRING THEM TO THE OFFICE FOR PROCESSING.   °DO NOT GIVE THEM TO YOUR DOCTOR. ° °1. A prescription for pain medication may be given to you upon  discharge.  Take your pain medication as prescribed, if needed.  If narcotic pain medicine is not needed, then you may take acetaminophen (Tylenol) or ibuprofen (Advil) as needed. °2. Take your usually prescribed medications unless otherwise directed. °3. If you need a refill on your pain medication, please contact your pharmacy.  They will contact our office to request authorization. Prescriptions will not be filled after 5pm or on week-ends. °4. You should follow a light diet the first few days after arrival home, such as soup and crackers, etc.  Be sure to include lots of fluids daily. °5. Most patients will experience some swelling and bruising in the area of the incisions.  Ice packs will help.  Swelling and bruising can take several days to resolve.  °6. It is common to experience some constipation if taking pain medication after surgery.  Increasing fluid intake and taking a stool softener (such as Colace) will usually help or prevent this problem from occurring.  A mild laxative (Milk of Magnesia or Miralax) should be taken according to package instructions if there are no bowel movements after 48 hours. °7. Unless discharge instructions indicate otherwise, you may remove your bandages 24-48 hours after surgery, and you may shower at that time.  You may have steri-strips (small skin tapes) in place directly over the incision.  These strips should be left on the skin for 7-10 days.  If your surgeon used skin glue on the incision, you may shower in 24 hours.  The glue will flake off over the next 2-3 weeks.  Any sutures or staples will be removed at the office during your follow-up visit. °8. ACTIVITIES:  You may resume regular (light) daily activities beginning the next day--such as daily self-care, walking, climbing stairs--gradually increasing activities as tolerated.  You may have sexual intercourse when it is comfortable.  Refrain from any heavy lifting or straining until approved by your doctor. °a. You  may drive when you are no longer taking prescription pain medication, you can comfortably wear a seatbelt, and you can safely maneuver your car and apply brakes. °b. RETURN TO WORK:  __________________________________________________________ °9. You should see your doctor in the office for a follow-up appointment approximately 2-3 weeks after your surgery.  Make sure that you call for this appointment within a day or two after you arrive home to insure a convenient appointment time. °10. OTHER INSTRUCTIONS: __________________________________________________________________________________________________________________________ __________________________________________________________________________________________________________________________ °WHEN TO CALL YOUR DOCTOR: °1. Fever over 101.0 °2. Inability to urinate °3. Continued bleeding from incision. °4. Increased pain, redness, or drainage from the incision. °5. Increasing abdominal pain ° °The clinic staff is available to answer your questions during regular business hours.  Please don’t hesitate to call and ask to speak to one of the nurses for clinical concerns.  If you have a medical emergency, go to the nearest emergency room or call 911.  A surgeon from   [facility] is always on call at the hospital. °[address], [address], [place], [place]  [address] ? [address] [address], , [place]   [address] °[phone] ? [phone] ? FAX [phone] °Web site: [email or web address] ° °

## 2017-06-05 NOTE — Progress Notes (Signed)
1 Day Post-Op    GU:[id number] pain   Subjective: Pt up to BR, hungry, nothing except clears so far.  Port sites look fine.  She want the shot - Oxycodone didn't help.  Port sites all look fine.  Objective: Vital signs in last 24 hours: Temp:  [96.8 F (36 C)-98.5 F (36.9 C)] 97.8 F (36.6 C) (08/27 0506) Pulse Rate:  [63-99] 63 (08/27 0506) Resp:  [16-20] 18 (08/27 0506) BP: (105-122)/(53-84) 116/69 (08/27 0506) SpO2:  [94 %-98 %] 98 % (08/27 0506) Weight:  [97.2 kg (214 lb 4 oz)] 97.2 kg (214 lb 4 oz) (08/26 0948) Last BM Date: 06/02/17 2000 IV Void x 1 Afebrile, VSS Glucose 140 WBC still up some 14.5K     Intake/Output from previous day: 08/26 0701 - 08/27 0700 In: 2000 [I.V.:1000; IV Piggyback:1000] Out: 5 [Blood:5] Intake/Output this shift: No intake/output data recorded.  General appearance: alert, cooperative and no distress Resp: clear to auscultation bilaterally GI: soft, obese, port sites all look good.    Lab Results:   Recent Labs  06/04/17 1014 06/05/17 0636  WBC 16.4* 14.5*  HGB 13.7 12.9  HCT 40.4 39.1  PLT 286 280    BMET  Recent Labs  06/04/17 1014 06/05/17 0636  NA 133* 137  K 3.9 3.9  CL 102 106  CO2 22 20*  GLUCOSE 81 140*  BUN 5* <5*  CREATININE 0.66 0.77  CALCIUM 9.1 9.0   PT/INR No results for input(s): LABPROT, INR in the last 72 hours.   Recent Labs Lab 06/04/17 1014  AST 55*  ALT 52  ALKPHOS 59  BILITOT 0.6  PROT 6.8  ALBUMIN 3.7     Lipase     Component Value Date/Time   LIPASE 28 06/04/2017 1014     Medications: . enoxaparin (LOVENOX) injection  40 mg Subcutaneous Q24H  . pneumococcal 23 valent vaccine  0.5 mL Intramuscular Tomorrow-1000   . dextrose 5 % and 0.9 % NaCl with KCl 20 mEq/L    . dextrose 5 % and 0.9% NaCl 100 mL/hr at 06/05/17 0500   Anti-infectives    Start     Dose/Rate Route Frequency Ordered Stop   06/05/17 0600  cefoTEtan in Dextrose 5% (CEFOTAN) IVPB 2 g     2 g Intravenous  On call to O.R. 06/04/17 1437 06/04/17 1524   06/04/17 2200  cefoTEtan (CEFOTAN) 2 g in dextrose 5 % 50 mL IVPB     2 g 100 mL/hr over 30 Minutes Intravenous Every 12 hours 06/04/17 1631 06/04/17 2156      Assessment/Plan Acute appendicitis Asthma Club foot Hyperlipidemia Body mass index is 41.8 FEN: IV fluids/regular diet ID:  preop Cefotetan DVT:  Lovenox    Plan:  Advance diet, mobilize and aim for D/C later today.   I have personally reviewed the patients medication history on the [place] controlled substance database.  LOS: 0 days    Jameria Bradway 06/05/2017 [phone]

## 2017-06-06 NOTE — Discharge Summary (Signed)
Physician Discharge Summary  Patient ID: Meghan Riggs MRN: [id number] DOB/AGE: 10/11/1995 21 y.o.  Admit date: 06/04/2017 Discharge date: 06/05/2017  Admission Diagnoses:  Acute appendicitis Asthma Club foot Hyperlipidemia Body mass index is 41.8 Discharge Diagnoses:  Active Problems:   Acute appendicitis   PROCEDURES: Laparoscopic appendectomy, 06/05/17, Dr. Marcello Moores [facility] Course:   Patient seen at the request of Dr. Vanita Panda for 1 day history of lower abdominal pain. The pain started earlier this morning at about 2 AM. Progressed throughout the morning is now located in her right lower quadrant route. She also has some nausea but no vomiting. The pain is made worse with movement and made better with rest. CT scan shows an inflamed appendix without perforation consistent with acute appendicitis.  She was admitted and taken to the OR by Dr. Brantley Stage the evening of admit.  She tolerated the procedure well.  Post op she had some issues with pain.  She was able to go home later POD 1.  CBC Latest Ref Rng & Units 06/05/2017 06/04/2017 10/26/2015  WBC 4.0 - 10.5 K/uL 14.5(H) 16.4(H) 9.5  Hemoglobin 12.0 - 15.0 g/dL 12.9 13.7 12.8  Hematocrit 36.0 - 46.0 % 39.1 40.4 37.3  Platelets 150 - 400 K/uL 280 286 317   CMP Latest Ref Rng & Units 06/05/2017 06/04/2017 10/26/2015  Glucose 65 - 99 mg/dL 140(H) 81 82  BUN 6 - 20 mg/dL <5(L) 5(L) 11  Creatinine 0.44 - 1.00 mg/dL 0.77 0.66 0.74  Sodium 135 - 145 mmol/L 137 133(L) 140  Potassium 3.5 - 5.1 mmol/L 3.9 3.9 3.6  Chloride 101 - 111 mmol/L 106 102 109  CO2 22 - 32 mmol/L 20(L) 22 21(L)  Calcium 8.9 - 10.3 mg/dL 9.0 9.1 9.6  Total Protein 6.5 - 8.1 g/dL - 6.8 7.6  Total Bilirubin 0.3 - 1.2 mg/dL - 0.6 0.3  Alkaline Phos 38 - 126 U/L - 59 57  AST 15 - 41 U/L - 55(H) 31  ALT 14 - 54 U/L - 52 33   Condition on D/C:  improved   Disposition: 01-Home or Self Care   Allergies as of 06/05/2017   No Known Allergies      Medication List    STOP taking these medications   cyclobenzaprine 10 MG tablet Commonly known as:  FLEXERIL   ondansetron 8 MG tablet Commonly known as:  ZOFRAN   ranitidine 75 MG tablet Commonly known as:  ZANTAC     TAKE these medications   acetaminophen 500 MG tablet Commonly known as:  TYLENOL Take this every 8 hours until you feel you do not need pain medicine.  It won't relieve all your symptoms but it should help.  You can buy this over the counter.  If you have 325 mg tablets you can take 3 to equal the dose with 2 - 500 mg tablets.  DO NOT TAKE MORE THAN 4000 MG OF TYLENOL PER DAY.  IT CAN HARM YOUR LIVER. What changed:  how much to take  how to take this  when to take this  reasons to take this  additional instructions   aspirin EC 81 MG tablet Take 243 mg by mouth every 6 (six) hours as needed for moderate pain.   etonogestrel-ethinyl estradiol 0.12-0.015 MG/24HR vaginal ring Commonly known as:  NUVARING Place 1 each vaginally See admin instructions. Insert vaginally and leave in place for 24 days then puts a new one in on the same day for 24 days and  then takes out for four days.   ibuprofen 200 MG tablet Commonly known as:  ADVIL,MOTRIN You can take 3 tablets every 6 hours as needed for pain.  You can take this in addition to the Tylenol.  This is the over the counter dose. What changed:  how much to take  how to take this  when to take this  reasons to take this  additional instructions   methocarbamol 750 MG tablet Commonly known as:  ROBAXIN Take 1 tablet (750 mg total) by mouth every 8 (eight) hours as needed (use for muscle cramps/pain).   omeprazole 20 MG capsule Commonly known as:  PRILOSEC Take 20 mg by mouth 2 (two) times daily as needed (For heartburn or acid reflux.).   oxyCODONE 5 MG immediate release tablet Commonly known as:  Oxy IR/ROXICODONE Take 1-2 tablets (5-10 mg total) by mouth every 4 (four) hours as needed for  moderate pain.   triamcinolone cream 0.1 % Commonly known as:  KENALOG Apply 1 application topically 2 (two) times daily. Apply to the area            Discharge Care Instructions        Start     Ordered   06/05/17 0000  acetaminophen (TYLENOL) 500 MG tablet    Question:  Supervising Provider  Answer:  Judeth Horn   06/05/17 0909   06/05/17 0000  ibuprofen (ADVIL,MOTRIN) 200 MG tablet    Question:  Supervising Provider  Answer:  Judeth Horn   06/05/17 0909   06/05/17 0000  oxyCODONE (OXY IR/ROXICODONE) 5 MG immediate release tablet  Every 4 hours PRN    Question:  Supervising Provider  Answer:  Judeth Horn   06/05/17 0909   06/05/17 0000  methocarbamol (ROBAXIN) 750 MG tablet  Every 8 hours PRN    Question:  Supervising Provider  Answer:  Judeth Horn   06/05/17 1332     Follow-up Information    [facility] Follow up on 06/20/2017.   Specialty:  General Surgery Why:  Your appointment is at 9:45 AM.  Bring your photo ID, insurance and be at the office 30 minutes early for check in.   Contact information: [address] [place] [address] [phone]           Signed: Earnstine Regal 06/06/2017, 1:29 PM

## 2017-07-18 ENCOUNTER — Emergency Department (HOSPITAL_COMMUNITY)
Admission: EM | Admit: 2017-07-18 | Discharge: 2017-07-18 | Disposition: A | Discharge status: Home / Self Care | Payer: Self-pay | Attending: Emergency Medicine | Admitting: Emergency Medicine

## 2017-07-18 ENCOUNTER — Encounter (HOSPITAL_COMMUNITY): Payer: Self-pay | Admitting: Emergency Medicine

## 2017-07-18 DIAGNOSIS — Z7982 Long term (current) use of aspirin: Secondary | ICD-10-CM | POA: Insufficient documentation

## 2017-07-18 DIAGNOSIS — Z79899 Other long term (current) drug therapy: Secondary | ICD-10-CM | POA: Insufficient documentation

## 2017-07-18 DIAGNOSIS — R21 Rash and other nonspecific skin eruption: Secondary | ICD-10-CM | POA: Insufficient documentation

## 2017-07-18 DIAGNOSIS — J45909 Unspecified asthma, uncomplicated: Secondary | ICD-10-CM | POA: Insufficient documentation

## 2017-07-18 MED ORDER — DEXAMETHASONE SODIUM PHOSPHATE 10 MG/ML IJ SOLN
10.0000 mg | Freq: Once | INTRAMUSCULAR | Status: AC
  Administered 2017-07-18: 10 mg via INTRAMUSCULAR
  Filled 2017-07-18: qty 1

## 2017-07-18 MED ORDER — HYDROXYZINE HCL 25 MG PO TABS: 25.0000 mg | ORAL_TABLET | Freq: Four times a day (QID) | ORAL | 0 refills | Status: DC

## 2017-07-18 MED ORDER — PREDNISONE 10 MG (21) PO TBPK: ORAL_TABLET | ORAL | 0 refills | Status: DC

## 2017-07-18 MED ORDER — HYDROXYZINE HCL 50 MG PO TABS
50.0000 mg | ORAL_TABLET | Freq: Once | ORAL | Status: AC
  Administered 2017-07-18: 50 mg via ORAL
  Filled 2017-07-18: qty 1

## 2017-07-18 NOTE — ED Provider Notes (Signed)
[facility] DEPT Provider Note   CSN: [id number] Arrival date & time: 07/18/17  1946     History   Chief Complaint Chief Complaint  Patient presents with  . Rash    HPI Meghan Riggs is a 21 y.o. female.  HPI   Meghan Riggs is a 21 y.o. female, with a history of Asthma, presenting to the ED with a pruritic and somewhat painful rash beginning 4-5 days ago. States she was working in the yard and then later that evening noticed an itching rash to her lower back. She also noticed similar lesions on her legs and arms. They have been persistent since that time. She has taken Benadryl with no relief. She denies fever/chills, nausea/vomiting, facial swelling, difficulty breathing, chest pain, or any other complaints.     Past Medical History:  Diagnosis Date  . Asthma    Dr. Donneta Romberg  . Club foot 1996   Congenital  . Hyperlipidemia     Patient Active Problem List   Diagnosis Date Noted  . Acute appendicitis 06/04/2017    Past Surgical History:  Procedure Laterality Date  . CLUB FOOT RELEASE  7215   @ 32 days old  . KNEE SURGERY    . LAPAROSCOPIC APPENDECTOMY N/A 06/04/2017   Procedure: APPENDECTOMY LAPAROSCOPIC;  Surgeon: Erroll Luna, MD;  Location: [facility];  Service: General;  Laterality: N/A;    OB History    No data available       Home Medications    Prior to Admission medications   Medication Sig Start Date End Date Taking? Authorizing Provider  acetaminophen (TYLENOL) 500 MG tablet Take this every 8 hours until you feel you do not need pain medicine.  It won't relieve all your symptoms but it should help.  You can buy this over the counter.  If you have 325 mg tablets you can take 3 to equal the dose with 2 - 500 mg tablets.  DO NOT TAKE MORE THAN 4000 MG OF TYLENOL PER DAY.  IT CAN HARM YOUR LIVER. 06/05/17   Earnstine Regal, PA-C  aspirin EC 81 MG tablet Take 243 mg by mouth every 6 (six) hours as needed for moderate pain.    [provider]  etonogestrel-ethinyl estradiol (NUVARING) 0.12-0.015 MG/24HR vaginal ring Place 1 each vaginally See admin instructions. Insert vaginally and leave in place for 24 days then puts a new one in on the same day for 24 days and then takes out for four days.    [provider]  hydrOXYzine (ATARAX/VISTARIL) 25 MG tablet Take 1 tablet (25 mg total) by mouth every 6 (six) hours. 07/18/17   Newell Wafer C, PA-C  ibuprofen (ADVIL,MOTRIN) 200 MG tablet You can take 3 tablets every 6 hours as needed for pain.  You can take this in addition to the Tylenol.  This is the over the counter dose. 06/05/17   Earnstine Regal, PA-C  methocarbamol (ROBAXIN) 750 MG tablet Take 1 tablet (750 mg total) by mouth every 8 (eight) hours as needed (use for muscle cramps/pain). 06/05/17   Earnstine Regal, PA-C  omeprazole (PRILOSEC) 20 MG capsule Take 20 mg by mouth 2 (two) times daily as needed (For heartburn or acid reflux.).     [provider]  oxyCODONE (OXY IR/ROXICODONE) 5 MG immediate release tablet Take 1-2 tablets (5-10 mg total) by mouth every 4 (four) hours as needed for moderate pain. 06/05/17   Earnstine Regal, PA-C  predniSONE (STERAPRED UNI-PAK 21 TAB) 10  MG (21) TBPK tablet Take 6 tabs by mouth daily  for 2 days, then 5 tabs for 2 days, then 4 tabs for 2 days, then 3 tabs for 2 days, 2 tabs for 2 days, then 1 tab by mouth daily for 2 days 07/18/17   Raynisha Avilla C, PA-C  triamcinolone cream (KENALOG) 0.1 % Apply 1 application topically 2 (two) times daily. Apply to the area Patient not taking: Reported on 06/04/2017 01/04/17   Doristine Devoid, PA-C    Family History Family History  Problem Relation Age of Onset  . Diabetes Father   . Diabetes Paternal Uncle   . Diabetes Paternal Grandmother     Social History Social History  Substance Use Topics  . Smoking status: Never Smoker  . Smokeless tobacco: Never Used  . Alcohol use No     Allergies   Patient has no known  allergies.   Review of Systems Review of Systems  Constitutional: Negative for chills, diaphoresis and fever.  Respiratory: Negative for cough and shortness of breath.   Cardiovascular: Negative for chest pain.  Gastrointestinal: Negative for abdominal pain, diarrhea, nausea and vomiting.  Musculoskeletal: Negative for neck pain and neck stiffness.  Skin: Positive for rash.  Neurological: Negative for dizziness, light-headedness and headaches.  All other systems reviewed and are negative.    Physical Exam Updated Vital Signs BP 125/77 (BP Location: Right Arm)   Pulse 88   Temp 98.9 F (37.2 C) (Oral)   Resp 16   Ht 5' (1.524 m)   Wt 97.5 kg (215 lb)   LMP 05/18/2017 (Approximate)   SpO2 100%   BMI 41.99 kg/m   Physical Exam  Constitutional: She appears well-developed and well-nourished. No distress.  HENT:  Head: Normocephalic and atraumatic.  No noted intraoral lesions. No angioedema. Patient handles oral secretions without difficulty.  Eyes: Conjunctivae are normal.  Neck: Normal range of motion. Neck supple.  Cardiovascular: Normal rate, regular rhythm, normal heart sounds and intact distal pulses.   Pulmonary/Chest: Effort normal and breath sounds normal. No respiratory distress.  No increased work of breathing. Patient speaks in full sentences without difficulty.  Abdominal: Soft. There is no tenderness. There is no guarding.  Musculoskeletal: She exhibits no edema.  Lymphadenopathy:    She has no cervical adenopathy.  Neurological: She is alert.  Skin: Skin is warm and dry. Capillary refill takes less than 2 seconds. Rash noted. She is not diaphoretic.  Large, flat, erythematous, slightly raised lesions across the patient's lower back, posterior legs, and upper extremities. Spares the palms and soles. No noted pustules, vesicles, or other lesions.  Psychiatric: She has a normal mood and affect. Her behavior is normal.  Nursing note and vitals reviewed.    ED  Treatments / Results  Labs (all labs ordered are listed, but only abnormal results are displayed) Labs Reviewed - No data to display  EKG  EKG Interpretation None       Radiology No results found.  Procedures Procedures (including critical care time)  Medications Ordered in ED Medications  dexamethasone (DECADRON) injection 10 mg (10 mg Intramuscular Given 07/18/17 2255)  hydrOXYzine (ATARAX/VISTARIL) tablet 50 mg (50 mg Oral Given 07/18/17 2255)     Initial Impression / Assessment and Plan / ED Course  I have reviewed the triage vital signs and the nursing notes.  Pertinent labs & imaging results that were available during my care of the patient were reviewed by me and considered in my medical decision making (  see chart for details).  Clinical Course as of Jul 18 2356  Tue Jul 18, 2017  2325 Patient voices improvement in her itching.  [SJ]    Clinical Course User Index [SJ] Pearse Shiffler C, PA-C    Patient presents with a rash after spending time outside. Has a appearance of hyperreactivity to environmental exposure or bite. No noted secondary systemic symptoms. The patient was given instructions for home care as well as return precautions. Patient voices understanding of these instructions, accepts the plan, and is comfortable with discharge.     Final Clinical Impressions(s) / ED Diagnoses   Final diagnoses:  Rash    New Prescriptions Discharge Medication List as of 07/18/2017 11:27 PM    START taking these medications   Details  hydrOXYzine (ATARAX/VISTARIL) 25 MG tablet Take 1 tablet (25 mg total) by mouth every 6 (six) hours., Starting Tue 07/18/2017, Print    predniSONE (STERAPRED UNI-PAK 21 TAB) 10 MG (21) TBPK tablet Take 6 tabs by mouth daily  for 2 days, then 5 tabs for 2 days, then 4 tabs for 2 days, then 3 tabs for 2 days, 2 tabs for 2 days, then 1 tab by mouth daily for 2 days, Print         Meghan Riggs 07/18/17 2357    Davonna Belling,  MD 07/19/17 1452

## 2017-07-18 NOTE — ED Triage Notes (Signed)
Pt st's she was working in the yard 5 days ago and then started breaking out in a rash with itching.

## 2017-07-18 NOTE — Discharge Instructions (Signed)
Please take the prednisone, as prescribed. May use the hydroxyzine, as needed, for itching. Do not drive or perform other dangerous activities while taking the hydroxyzine.

## 2017-11-15 ENCOUNTER — Encounter (HOSPITAL_COMMUNITY): Payer: Self-pay

## 2017-11-15 ENCOUNTER — Emergency Department (HOSPITAL_COMMUNITY): Payer: Self-pay

## 2017-11-15 ENCOUNTER — Emergency Department (HOSPITAL_COMMUNITY)
Admission: EM | Admit: 2017-11-15 | Discharge: 2017-11-15 | Disposition: A | Discharge status: Home / Self Care | Payer: Self-pay | Attending: Emergency Medicine | Admitting: Emergency Medicine

## 2017-11-15 ENCOUNTER — Other Ambulatory Visit: Payer: Self-pay

## 2017-11-15 DIAGNOSIS — G501 Atypical facial pain: Secondary | ICD-10-CM | POA: Insufficient documentation

## 2017-11-15 DIAGNOSIS — Z79899 Other long term (current) drug therapy: Secondary | ICD-10-CM | POA: Insufficient documentation

## 2017-11-15 DIAGNOSIS — R519 Headache, unspecified: Secondary | ICD-10-CM

## 2017-11-15 DIAGNOSIS — J45909 Unspecified asthma, uncomplicated: Secondary | ICD-10-CM | POA: Insufficient documentation

## 2017-11-15 DIAGNOSIS — J3489 Other specified disorders of nose and nasal sinuses: Secondary | ICD-10-CM | POA: Insufficient documentation

## 2017-11-15 DIAGNOSIS — R51 Headache: Secondary | ICD-10-CM

## 2017-11-15 LAB — I-STAT BETA HCG BLOOD, ED (MC, WL, AP ONLY): I-stat hCG, quantitative: <5 m[IU]/mL (ref <5)

## 2017-11-15 MED ORDER — METOCLOPRAMIDE HCL 5 MG/ML IJ SOLN: 10.0000 mg | Freq: Once | INTRAMUSCULAR | Status: DC

## 2017-11-15 MED ORDER — ACETAMINOPHEN 500 MG PO TABS
1000.0000 mg | ORAL_TABLET | Freq: Once | ORAL | Status: AC
  Administered 2017-11-15: 1000 mg via ORAL
  Filled 2017-11-15: qty 2

## 2017-11-15 MED ORDER — KETOROLAC TROMETHAMINE 15 MG/ML IJ SOLN
15.0000 mg | Freq: Once | INTRAMUSCULAR | Status: AC
  Administered 2017-11-15: 15 mg via INTRAVENOUS
  Filled 2017-11-15: qty 1

## 2017-11-15 MED ORDER — AMOXICILLIN-POT CLAVULANATE 875-125 MG PO TABS: 1.0000 | ORAL_TABLET | Freq: Two times a day (BID) | ORAL | 0 refills | Status: DC

## 2017-11-15 MED ORDER — IPRATROPIUM-ALBUTEROL 0.5-2.5 (3) MG/3ML IN SOLN
3.0000 mL | Freq: Once | RESPIRATORY_TRACT | Status: AC
  Administered 2017-11-15: 3 mL via RESPIRATORY_TRACT
  Filled 2017-11-15: qty 3

## 2017-11-15 MED ORDER — KETOROLAC TROMETHAMINE 15 MG/ML IJ SOLN: 15.0000 mg | Freq: Once | INTRAMUSCULAR | Status: DC

## 2017-11-15 MED ORDER — METOCLOPRAMIDE HCL 5 MG/ML IJ SOLN
10.0000 mg | Freq: Once | INTRAMUSCULAR | Status: AC
  Administered 2017-11-15: 10 mg via INTRAVENOUS
  Filled 2017-11-15: qty 2

## 2017-11-15 MED ORDER — DIPHENHYDRAMINE HCL 50 MG/ML IJ SOLN
12.5000 mg | Freq: Once | INTRAMUSCULAR | Status: AC
  Administered 2017-11-15: 12.5 mg via INTRAVENOUS
  Filled 2017-11-15: qty 1

## 2017-11-15 MED ORDER — SODIUM CHLORIDE 0.9 % IV BOLUS (SEPSIS)
1000.0000 mL | Freq: Once | INTRAVENOUS | Status: AC
  Administered 2017-11-15: 1000 mL via INTRAVENOUS

## 2017-11-15 MED ORDER — OPTICHAMBER DIAMOND MISC
1.0000 | Freq: Once | Status: DC
  Filled 2017-11-15: qty 1

## 2017-11-15 MED ORDER — AEROCHAMBER PLUS FLO-VU MEDIUM MISC
1.0000 | Freq: Once | Status: DC
Start: 2017-11-15 — End: 2017-11-15
  Filled 2017-11-15: qty 1

## 2017-11-15 MED ORDER — ALBUTEROL SULFATE HFA 108 (90 BASE) MCG/ACT IN AERS
1.0000 | INHALATION_SPRAY | RESPIRATORY_TRACT | Status: DC | PRN
  Administered 2017-11-15: 2 via RESPIRATORY_TRACT
  Filled 2017-11-15: qty 6.7

## 2017-11-15 NOTE — ED Triage Notes (Signed)
Patient complains of greater than 1 week of cough and congestion and intermittent eye pain. States that the cough is worse at night when she is supine. NAD

## 2017-11-15 NOTE — ED Provider Notes (Signed)
[facility] EMERGENCY DEPARTMENT Provider Note   CSN: [id number] Arrival date & time: 11/15/17  4403     History   Chief Complaint No chief complaint on file.   HPI Meghan Riggs is a 22 y.o. female who presents today for evaluation of cough and intermittent left eye pain for approximately 1 week.  She reports that she has not had anything over-the-counter today, has previously taken Tylenol with mild relief.  She reports pressure/throbbing over her forehead and cheeks bilaterally.  She reports congestion, cough, sore throat, and chest tightness.  She has a history of migraines but that is normally all over her head and this is her forehead and cheeks and left eye.  Her head pain is made worse with position changes.  She denies fevers.  Her chest feels tight and like she can't get a full breath in.  She has a history of asthma but has not had an inhaler, she reports this feels similar with burning when she coughs.  Her chest pain is her bilateral chest and is exacerbated with coughing.    HPI  Past Medical History:  Diagnosis Date  . Asthma    Dr. Donneta Romberg  . Club foot 1996   Congenital  . Hyperlipidemia     Patient Active Problem List   Diagnosis Date Noted  . Acute appendicitis 06/04/2017    Past Surgical History:  Procedure Laterality Date  . CLUB FOOT RELEASE  4940   @ 5 days old  . KNEE SURGERY    . LAPAROSCOPIC APPENDECTOMY N/A 06/04/2017   Procedure: APPENDECTOMY LAPAROSCOPIC;  Surgeon: Erroll Luna, MD;  Location: [facility];  Service: General;  Laterality: N/A;    OB History    No data available       Home Medications    Prior to Admission medications   Medication Sig Start Date End Date Taking? Authorizing Provider  acetaminophen (TYLENOL) 500 MG tablet Take this every 8 hours until you feel you do not need pain medicine.  It won't relieve all your symptoms but it should help.  You can buy this over the counter.  If you have 325 mg  tablets you can take 3 to equal the dose with 2 - 500 mg tablets.  DO NOT TAKE MORE THAN 4000 MG OF TYLENOL PER DAY.  IT CAN HARM YOUR LIVER. 06/05/17   Earnstine Regal, PA-C  amoxicillin-clavulanate (AUGMENTIN) 875-125 MG tablet Take 1 tablet by mouth every 12 (twelve) hours. 11/15/17   Lorin Glass, PA-C  aspirin EC 81 MG tablet Take 243 mg by mouth every 6 (six) hours as needed for moderate pain.    [provider]  etonogestrel-ethinyl estradiol (NUVARING) 0.12-0.015 MG/24HR vaginal ring Place 1 each vaginally See admin instructions. Insert vaginally and leave in place for 24 days then puts a new one in on the same day for 24 days and then takes out for four days.    [provider]  hydrOXYzine (ATARAX/VISTARIL) 25 MG tablet Take 1 tablet (25 mg total) by mouth every 6 (six) hours. 07/18/17   Joy, Shawn C, PA-C  ibuprofen (ADVIL,MOTRIN) 200 MG tablet You can take 3 tablets every 6 hours as needed for pain.  You can take this in addition to the Tylenol.  This is the over the counter dose. 06/05/17   Earnstine Regal, PA-C  methocarbamol (ROBAXIN) 750 MG tablet Take 1 tablet (750 mg total) by mouth every 8 (eight) hours as needed (use for muscle  cramps/pain). 06/05/17   Earnstine Regal, PA-C  omeprazole (PRILOSEC) 20 MG capsule Take 20 mg by mouth 2 (two) times daily as needed (For heartburn or acid reflux.).     [provider]  oxyCODONE (OXY IR/ROXICODONE) 5 MG immediate release tablet Take 1-2 tablets (5-10 mg total) by mouth every 4 (four) hours as needed for moderate pain. 06/05/17   Earnstine Regal, PA-C  predniSONE (STERAPRED UNI-PAK 21 TAB) 10 MG (21) TBPK tablet Take 6 tabs by mouth daily  for 2 days, then 5 tabs for 2 days, then 4 tabs for 2 days, then 3 tabs for 2 days, 2 tabs for 2 days, then 1 tab by mouth daily for 2 days 07/18/17   Joy, Shawn C, PA-C  triamcinolone cream (KENALOG) 0.1 % Apply 1 application topically 2 (two) times daily. Apply to the  area Patient not taking: Reported on 06/04/2017 01/04/17   Doristine Devoid, PA-C    Family History Family History  Problem Relation Age of Onset  . Diabetes Father   . Diabetes Paternal Uncle   . Diabetes Paternal Grandmother     Social History Social History   Tobacco Use  . Smoking status: Never Smoker  . Smokeless tobacco: Never Used  Substance Use Topics  . Alcohol use: No  . Drug use: No     Allergies   Patient has no known allergies.   Review of Systems Review of Systems  Constitutional: Positive for chills. Negative for fever.  HENT: Positive for postnasal drip, rhinorrhea, sinus pressure, sinus pain and sore throat.   Eyes: Positive for pain. Negative for visual disturbance.  Respiratory: Positive for cough, chest tightness and shortness of breath.   Cardiovascular: Positive for chest pain.  Gastrointestinal: Negative for abdominal pain, nausea and vomiting.  Genitourinary: Negative for dysuria.  Musculoskeletal: Negative for neck pain and neck stiffness.  Skin: Negative for color change and wound.  Neurological: Negative for headaches.  Psychiatric/Behavioral: Negative for confusion.  All other systems reviewed and are negative.    Physical Exam Updated Vital Signs BP (!) 126/95 (BP Location: Right Arm)   Pulse 79   Temp 98.3 F (36.8 C) (Oral)   Resp 20   LMP 09/14/2017 Comment: birth control  SpO2 99%   Physical Exam  Constitutional: She is oriented to person, place, and time. She appears well-developed and well-nourished. No distress.  HENT:  Head: Normocephalic and atraumatic.  Nose: Nose normal.  Mouth/Throat: Oropharynx is clear and moist. No oropharyngeal exudate.  Eyes: Conjunctivae and EOM are normal. Pupils are equal, round, and reactive to light.  No obvious facial swelling.  Neck: Normal range of motion. Neck supple.  Cardiovascular: Normal rate, regular rhythm, normal heart sounds and intact distal pulses.  No murmur  heard. Pulmonary/Chest: Effort normal and breath sounds normal. No respiratory distress. She has no wheezes. She exhibits tenderness (Palpation over anterior chest both re-creates and exacerbates her reported pain exactly.).  Abdominal: Soft. Bowel sounds are normal. She exhibits no distension. There is no tenderness. There is no guarding.  Musculoskeletal: She exhibits no edema or deformity.  Lymphadenopathy:    She has cervical adenopathy.  Neurological: She is alert and oriented to person, place, and time.  Skin: Skin is warm and dry.  Psychiatric: She has a normal mood and affect. Her behavior is normal.  Nursing note and vitals reviewed.    ED Treatments / Results  Labs (all labs ordered are listed, but only abnormal results are displayed) Labs Reviewed  I-STAT BETA HCG BLOOD, ED ([facility], [facility], [facility] ONLY)  I-stat negative, did not cross over.   EKG  EKG Interpretation None       Radiology Dg Chest 2 View  Result Date: 11/15/2017 CLINICAL DATA:  Chest and throat soreness, cough and congestion for 3-4 days. EXAM: CHEST  2 VIEW COMPARISON:  PA and lateral chest 11/14/2016. FINDINGS: Lungs are clear. Heart size is normal. No pneumothorax or pleural fluid. No bony abnormality. IMPRESSION: Normal chest. Electronically Signed   By: Inge Rise M.D.   On: 11/15/2017 10:16    Procedures Procedures (including critical care time)  Medications Ordered in ED Medications  albuterol (PROVENTIL HFA;VENTOLIN HFA) 108 (90 Base) MCG/ACT inhaler 1-2 puff (2 puffs Inhalation Given 11/15/17 1432)  AEROCHAMBER PLUS FLO-VU MEDIUM MISC 1 each (not administered)  diphenhydrAMINE (BENADRYL) injection 12.5 mg (12.5 mg Intravenous Given 11/15/17 1305)  sodium chloride 0.9 % bolus 1,000 mL (0 mLs Intravenous Stopped 11/15/17 1351)  ipratropium-albuterol (DUONEB) 0.5-2.5 (3) MG/3ML nebulizer solution 3 mL (3 mLs Nebulization Given 11/15/17 1304)  acetaminophen (TYLENOL) tablet 1,000 mg (1,000 mg Oral Given  11/15/17 1316)  ketorolac (TORADOL) 15 MG/ML injection 15 mg (15 mg Intravenous Given 11/15/17 1347)  metoCLOPramide (REGLAN) injection 10 mg (10 mg Intravenous Given 11/15/17 1347)     Initial Impression / Assessment and Plan / ED Course  I have reviewed the triage vital signs and the nursing notes.  Pertinent labs & imaging results that were available during my care of the patient were reviewed by me and considered in my medical decision making (see chart for details).  Clinical Course as of Nov 16 1431  Wed Nov 15, 2017  1418 Patient stated she feels better and is ready to leave.   [EH]    Clinical Course User Index [EH] Lorin Glass, PA-C   Patient complaining of symptoms of sinusitis.    Severe symptoms  with purulent nasal discharge and maxillary/facial sinus pain.  Occasional facial swelling.   Concern for acute bacterial rhinosinusitis.  Patient discharged with Augmentin.  Instructions given for warm saline nasal wash and recommendations for follow-up with primary care physician.    I am not concerned for periorbital cellulitis, there is obvious facial swelling on exam.  She denies any recent trauma.  Does not have any rashes around her face/eye.  Her facial pain and reported blurry vision in her eye greatly improved after being treated with migraine cocktail.  She reports that normally the vision in her left eye is worse than her right.  She does not know what it normally is, so unable to objectively measure how much worse it is.  She was offered additional workup, however declined stating that she wishes to go home.  She was instructed on basic supportive symptoms for a cold.  Her chest pain is re-created and exacerbated with chest palpation, based on history physical exam I do not suspect ACS.  Suspect her chest pain is musculoskeletal in nature.  Given constellation of symptoms I do not suspect PE.  Patient was given strict return precautions, states understanding.  She was  instructed to follow-up with primary care provider in 1-2 days.  She was dictated on the normal course for sinusitis/URI.  She was advised not to drive today since the benadryl may make her sleepy.      Final Clinical Impressions(s) / ED Diagnoses   Final diagnoses:  Sinus pain  Left-sided face pain    ED Discharge Orders  Ordered    amoxicillin-clavulanate (AUGMENTIN) 875-125 MG tablet  Every 12 hours     11/15/17 1423       Lorin Glass, [place] 11/15/17 1433    Blanchie Dessert, MD 11/15/17 [phone]

## 2017-11-15 NOTE — Discharge Instructions (Addendum)
You have been given albuterol inhaler.  He may use this every 4-6 hours as needed for shortness of breath, chest tightness, or wheezing.  Please use 1-2 puffs.  Please use this with the spacer/holding chamber with that you were given today.  Please take Ibuprofen (Advil, motrin) and Tylenol (acetaminophen) to relieve your pain.  You may take up to 600 MG (3 pills) of normal strength ibuprofen every 8 hours as needed.  In between doses of ibuprofen you make take tylenol, up to 1,000 mg (two extra strength pills).  Do not take more than 3,000 mg tylenol in a 24 hour period.  Please check all medication labels as many medications such as pain and cold medications may contain tylenol.  Do not drink alcohol while taking these medications.  Do not take other NSAID'S while taking ibuprofen (such as aleve or naproxen).  Please take ibuprofen with food to decrease stomach upset.   You may have diarrhea from the antibiotics.  It is very important that you continue to take the antibiotics even if you get diarrhea unless a medical professional tells you that you may stop taking them.  If you stop too early the bacteria you are being treated for will become stronger and you may need different, more powerful antibiotics that have more side effects and worsening diarrhea.  Please stay well hydrated and consider probiotics as they may decrease the severity of your diarrhea.  Please be aware that if you take any hormonal contraception (birth control pills, nexplanon, the ring, etc) that your birth control will not work while you are taking antibiotics and you need to use back up protection as directed on the birth control medication information insert.   Even when you get diarrhea do not stop taking your antibiotic!  Please make sure that you are staying well-hydrated.  You may take Mucinex, and Sudafed, and other over the counter measures to help you feel better.    If your headache or vision worsens then please follow  up with your doctor.

## 2017-12-28 ENCOUNTER — Emergency Department (HOSPITAL_COMMUNITY): Payer: Self-pay

## 2017-12-28 ENCOUNTER — Other Ambulatory Visit: Payer: Self-pay

## 2017-12-28 ENCOUNTER — Encounter (HOSPITAL_COMMUNITY): Payer: Self-pay | Admitting: Emergency Medicine

## 2017-12-28 ENCOUNTER — Emergency Department (HOSPITAL_COMMUNITY)
Admission: EM | Admit: 2017-12-28 | Discharge: 2017-12-28 | Discharge status: Against Medical Advice | Payer: Self-pay | Attending: Emergency Medicine | Admitting: Emergency Medicine

## 2017-12-28 DIAGNOSIS — R112 Nausea with vomiting, unspecified: Secondary | ICD-10-CM | POA: Insufficient documentation

## 2017-12-28 DIAGNOSIS — R197 Diarrhea, unspecified: Secondary | ICD-10-CM | POA: Insufficient documentation

## 2017-12-28 DIAGNOSIS — K921 Melena: Secondary | ICD-10-CM | POA: Insufficient documentation

## 2017-12-28 DIAGNOSIS — Z5320 Procedure and treatment not carried out because of patient's decision for unspecified reasons: Secondary | ICD-10-CM | POA: Insufficient documentation

## 2017-12-28 DIAGNOSIS — R1012 Left upper quadrant pain: Secondary | ICD-10-CM | POA: Insufficient documentation

## 2017-12-28 DIAGNOSIS — R109 Unspecified abdominal pain: Secondary | ICD-10-CM

## 2017-12-28 DIAGNOSIS — R51 Headache: Secondary | ICD-10-CM | POA: Insufficient documentation

## 2017-12-28 DIAGNOSIS — R1013 Epigastric pain: Secondary | ICD-10-CM | POA: Insufficient documentation

## 2017-12-28 DIAGNOSIS — R1011 Right upper quadrant pain: Secondary | ICD-10-CM | POA: Insufficient documentation

## 2017-12-28 LAB — CBC
HEMATOCRIT: 39.6 % (ref 36.0–46.0)
Hemoglobin: 13.4 g/dL (ref 12.0–15.0)
MCH: 30.1 pg (ref 26.0–34.0)
MCHC: 33.8 g/dL (ref 30.0–36.0)
MCV: 89 fL (ref 78.0–100.0)
PLATELETS: 283 10*3/uL (ref 150–400)
RBC: 4.45 MIL/uL (ref 3.87–5.11)
RDW: 12.1 % (ref 11.5–15.5)
WBC: 9.3 10*3/uL (ref 4.0–10.5)

## 2017-12-28 LAB — COMPREHENSIVE METABOLIC PANEL
ALT: 38 U/L (ref 14–54)
AST: 37 U/L (ref 15–41)
Albumin: 3.7 g/dL (ref 3.5–5.0)
Alkaline Phosphatase: 69 U/L (ref 38–126)
Anion gap: 11 (ref 5–15)
BUN: 6 mg/dL (ref 6–20)
CO2: 23 mmol/L (ref 22–32)
CREATININE: 0.76 mg/dL (ref 0.44–1.00)
Calcium: 9.2 mg/dL (ref 8.9–10.3)
Chloride: 103 mmol/L (ref 101–111)
GFR calc Af Amer: >60 mL/min (ref >60)
Glucose, Bld: 103 mg/dL — ABNORMAL HIGH (ref 65–99)
POTASSIUM: 3.7 mmol/L (ref 3.5–5.1)
Sodium: 137 mmol/L (ref 135–145)
Total Bilirubin: 0.5 mg/dL (ref 0.3–1.2)
Total Protein: 7.1 g/dL (ref 6.5–8.1)

## 2017-12-28 LAB — I-STAT BETA HCG BLOOD, ED (MC, WL, AP ONLY)

## 2017-12-28 LAB — LIPASE, BLOOD: Lipase: 22 U/L (ref 11–51)

## 2017-12-28 MED ORDER — SODIUM CHLORIDE 0.9 % IV BOLUS (SEPSIS): 1000.0000 mL | Freq: Once | INTRAVENOUS | Status: DC

## 2017-12-28 MED ORDER — IOPAMIDOL (ISOVUE-300) INJECTION 61%: 100.0000 mL | Freq: Once | INTRAVENOUS | Status: DC | PRN

## 2017-12-28 MED ORDER — IOPAMIDOL (ISOVUE-300) INJECTION 61%
INTRAVENOUS | Status: AC
  Filled 2017-12-28: qty 100

## 2017-12-28 MED ORDER — GI COCKTAIL ~~LOC~~: 30.0000 mL | Freq: Once | ORAL | Status: DC

## 2017-12-28 MED ORDER — SODIUM CHLORIDE 0.9 % IJ SOLN
INTRAMUSCULAR | Status: AC
  Filled 2017-12-28: qty 50

## 2017-12-28 MED ORDER — ONDANSETRON HCL 4 MG/2ML IJ SOLN: 4.0000 mg | Freq: Once | INTRAMUSCULAR | Status: DC

## 2017-12-28 NOTE — ED Notes (Signed)
Per Hanley Seamen charge RN primary RN took IV out.

## 2017-12-28 NOTE — ED Provider Notes (Signed)
[facility] DEPT Provider Note   CSN: [id number] Arrival date & time: 12/28/17  8101     History   Chief Complaint Chief Complaint  Patient presents with  . Abdominal Pain    HPI Meghan Riggs is a 22 y.o. female.  HPI Meghan Riggs is a 22 y.o. female with history of asthma, presents to emergency department complaining of abdominal pain, nausea, vomiting, diarrhea, blood in stool.  Patient states she has been sick since yesterday.  She reports numerous episodes of emesis which are accompanied by pain and diarrhea.  She has seen bright red blood in her stool all day yesterday.  She denies any rectal pain.  She reports subjective fever and chills, did not check her temperature at home.  She reports night sweats, states she had to change her clothes last night because they were soaked.  Denies any upper respiratory symptoms.  No sick contacts.  Past Medical History:  Diagnosis Date  . Asthma    Dr. Donneta Romberg  . Club foot 1996   Congenital  . Hyperlipidemia     Patient Active Problem List   Diagnosis Date Noted  . Acute appendicitis 06/04/2017    Past Surgical History:  Procedure Laterality Date  . CLUB FOOT RELEASE  2421   @ 66 days old  . KNEE SURGERY    . LAPAROSCOPIC APPENDECTOMY N/A 06/04/2017   Procedure: APPENDECTOMY LAPAROSCOPIC;  Surgeon: Erroll Luna, MD;  Location: [facility];  Service: General;  Laterality: N/A;    OB History   None      Home Medications    Prior to Admission medications   Medication Sig Start Date End Date Taking? Authorizing Provider  albuterol (PROVENTIL HFA;VENTOLIN HFA) 108 (90 Base) MCG/ACT inhaler Inhale 1-2 puffs into the lungs every 6 (six) hours as needed for wheezing or shortness of breath.   Yes [provider]  etonogestrel-ethinyl estradiol (NUVARING) 0.12-0.015 MG/24HR vaginal ring Place 1 each vaginally See admin instructions. Insert vaginally and leave in place for 24 days  then puts a new one in on the same day for 24 days and then takes out for four days.   Yes [provider]  guaiFENesin (MUCUS RELIEF CHEST CONGESTION) 100 MG/5ML liquid Take 200 mg by mouth once.   Yes [provider]  amoxicillin-clavulanate (AUGMENTIN) 875-125 MG tablet Take 1 tablet by mouth every 12 (twelve) hours. Patient not taking: Reported on 12/28/2017 11/15/17   Lorin Glass, PA-C    Family History Family History  Problem Relation Age of Onset  . Diabetes Father   . Diabetes Paternal Uncle   . Diabetes Paternal Grandmother     Social History Social History   Tobacco Use  . Smoking status: Never Smoker  . Smokeless tobacco: Never Used  Substance Use Topics  . Alcohol use: No  . Drug use: No     Allergies   Patient has no known allergies.   Review of Systems Review of Systems  Constitutional: Positive for chills and fever.  Respiratory: Negative for cough, chest tightness and shortness of breath.   Cardiovascular: Negative for chest pain, palpitations and leg swelling.  Gastrointestinal: Positive for abdominal pain, blood in stool, diarrhea, nausea and vomiting.  Genitourinary: Negative for dysuria, flank pain, pelvic pain, vaginal bleeding, vaginal discharge and vaginal pain.  Musculoskeletal: Negative for arthralgias, myalgias, neck pain and neck stiffness.  Skin: Negative for rash.  Neurological: Positive for headaches. Negative for dizziness and weakness.  All  other systems reviewed and are negative.    Physical Exam Updated Vital Signs BP 126/69   Pulse 69   Temp (!) 97.5 F (36.4 C) (Oral)   Resp 16   LMP 11/30/2017   SpO2 98%   Physical Exam  Constitutional: She appears well-developed and well-nourished. No distress.  HENT:  Head: Normocephalic.  Eyes: Conjunctivae are normal.  Neck: Neck supple.  Cardiovascular: Normal rate, regular rhythm and normal heart sounds.  Pulmonary/Chest: Effort normal and breath sounds  normal. No respiratory distress. She has no wheezes. She has no rales.  Abdominal: Soft. Bowel sounds are normal. She exhibits no distension. There is tenderness. There is no rebound.  Epigastric, LUQ, RUQ tenderness.   Musculoskeletal: She exhibits no edema.  Neurological: She is alert.  Skin: Skin is warm and dry.  Psychiatric: She has a normal mood and affect. Her behavior is normal.  Nursing note and vitals reviewed.    ED Treatments / Results  Labs (all labs ordered are listed, but only abnormal results are displayed) Labs Reviewed  CBC  LIPASE, BLOOD  COMPREHENSIVE METABOLIC PANEL  URINALYSIS, ROUTINE W REFLEX MICROSCOPIC  I-STAT BETA HCG BLOOD, ED ([facility], [facility], [facility] ONLY)    EKG  EKG Interpretation None       Radiology No results found.  Procedures Procedures (including critical care time)  Medications Ordered in ED Medications - No data to display   Initial Impression / Assessment and Plan / ED Course  I have reviewed the triage vital signs and the nursing notes.  Pertinent labs & imaging results that were available during my care of the patient were reviewed by me and considered in my medical decision making (see chart for details).     Patient in the emergency department with nausea, vomiting, diarrhea, significant abdominal pain, blood in her stool.  Will get CT abdomen pelvis to rule out colitis.  Will give IV fluids, Zofran.  Blood work ordered as well.  Pregnancy test ordered. VS normal. No peritoneal signs  Pt eloped without waiting on her tests.    Final Clinical Impressions(s) / ED Diagnoses   Final diagnoses:  Abdominal pain, unspecified abdominal location    ED Discharge Orders    None       Jeannett Senior, PA-C 12/28/17 1502    Davonna Belling, MD 12/28/17 1555

## 2017-12-28 NOTE — ED Notes (Signed)
Pt verbalizes "I am going to leave." Pt continues to verbalizes "I have not seen the head nurse but I am not waiting anymore. I don't want to be here another 3 hours." Pt informed by primary nurse and this writer that orders have been placed to treat. Pt still wanting to leave; pt informed of risks of leaving AMA.

## 2017-12-28 NOTE — ED Notes (Signed)
Patient demanded RN to take IV out. Stated she didn't want to wait any longer and wished to leave AMA.

## 2017-12-28 NOTE — ED Triage Notes (Signed)
Pt complaint of abdominal pain with n/v/d and "bright red blood in it" onset throughout the night.

## 2017-12-28 NOTE — ED Notes (Signed)
Pt walked up to triage requesting to speak with head nurse. Pt tearful verbalizes "I have been waiting two hours; I have been sick all night." Hanley Seamen charge RN made aware that pt requesting to speak with her. Pt walked back to room to maintain privacy.

## 2018-08-03 ENCOUNTER — Emergency Department (HOSPITAL_COMMUNITY): Payer: Self-pay

## 2018-08-03 ENCOUNTER — Emergency Department (HOSPITAL_COMMUNITY)
Admission: EM | Admit: 2018-08-03 | Discharge: 2018-08-03 | Disposition: A | Discharge status: Home / Self Care | Payer: Self-pay | Attending: Emergency Medicine | Admitting: Emergency Medicine

## 2018-08-03 ENCOUNTER — Encounter (HOSPITAL_COMMUNITY): Payer: Self-pay | Admitting: *Deleted

## 2018-08-03 DIAGNOSIS — Z79899 Other long term (current) drug therapy: Secondary | ICD-10-CM | POA: Diagnosis not present

## 2018-08-03 DIAGNOSIS — J45909 Unspecified asthma, uncomplicated: Secondary | ICD-10-CM | POA: Diagnosis not present

## 2018-08-03 DIAGNOSIS — M542 Cervicalgia: Secondary | ICD-10-CM | POA: Insufficient documentation

## 2018-08-03 DIAGNOSIS — M25511 Pain in right shoulder: Secondary | ICD-10-CM | POA: Insufficient documentation

## 2018-08-03 DIAGNOSIS — M545 Low back pain, unspecified: Secondary | ICD-10-CM

## 2018-08-03 MED ORDER — ONDANSETRON 4 MG PO TBDP
4.0000 mg | ORAL_TABLET | Freq: Once | ORAL | Status: AC
  Administered 2018-08-03: 4 mg via ORAL
  Filled 2018-08-03: qty 1

## 2018-08-03 MED ORDER — CYCLOBENZAPRINE HCL 10 MG PO TABS: 10.0000 mg | ORAL_TABLET | Freq: Two times a day (BID) | ORAL | 0 refills | Status: DC | PRN

## 2018-08-03 MED ORDER — OXYCODONE-ACETAMINOPHEN 5-325 MG PO TABS
1.0000 | ORAL_TABLET | Freq: Once | ORAL | Status: AC
  Administered 2018-08-03: 1 via ORAL
  Filled 2018-08-03: qty 1
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
## 2018-08-03 NOTE — ED Notes (Signed)
Pt ambulated to restroom- reports pain improved

## 2018-08-03 NOTE — ED Notes (Signed)
Patient transported to X-ray 
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
## 2018-08-03 NOTE — ED Notes (Signed)
Hot pack given.  

## 2018-09-10 ENCOUNTER — Other Ambulatory Visit: Payer: Self-pay

## 2018-09-10 ENCOUNTER — Encounter: Payer: Self-pay | Admitting: Internal Medicine

## 2018-09-10 ENCOUNTER — Ambulatory Visit (INDEPENDENT_AMBULATORY_CARE_PROVIDER_SITE_OTHER): Payer: Self-pay | Admitting: Internal Medicine

## 2018-09-10 VITALS — BP 122/70 | HR 72 | Temp 98.6°F | Ht 60.0 in | Wt 213.7 lb

## 2018-09-10 DIAGNOSIS — E785 Hyperlipidemia, unspecified: Secondary | ICD-10-CM | POA: Insufficient documentation

## 2018-09-10 DIAGNOSIS — N390 Urinary tract infection, site not specified: Secondary | ICD-10-CM

## 2018-09-10 DIAGNOSIS — Z8639 Personal history of other endocrine, nutritional and metabolic disease: Secondary | ICD-10-CM

## 2018-09-10 DIAGNOSIS — R109 Unspecified abdominal pain: Secondary | ICD-10-CM | POA: Insufficient documentation

## 2018-09-10 DIAGNOSIS — J45909 Unspecified asthma, uncomplicated: Secondary | ICD-10-CM | POA: Insufficient documentation

## 2018-09-10 DIAGNOSIS — R32 Unspecified urinary incontinence: Secondary | ICD-10-CM

## 2018-09-10 DIAGNOSIS — Z79899 Other long term (current) drug therapy: Secondary | ICD-10-CM

## 2018-09-10 DIAGNOSIS — R319 Hematuria, unspecified: Secondary | ICD-10-CM

## 2018-09-10 DIAGNOSIS — N3642 Intrinsic sphincter deficiency (ISD): Secondary | ICD-10-CM

## 2018-09-10 DIAGNOSIS — Z87440 Personal history of urinary (tract) infections: Secondary | ICD-10-CM

## 2018-09-10 DIAGNOSIS — N3644 Muscular disorders of urethra: Secondary | ICD-10-CM | POA: Insufficient documentation

## 2018-09-10 LAB — POCT URINALYSIS DIPSTICK
Bilirubin, UA: NEGATIVE
Glucose, UA: NEGATIVE
Ketones, UA: NEGATIVE
NITRITE UA: NEGATIVE
PH UA: 6 (ref 5.0–8.0)
Protein, UA: NEGATIVE
SPEC GRAV UA: 1.025 (ref 1.010–1.025)
UROBILINOGEN UA: 0.2 U/dL

## 2018-09-10 MED ORDER — SULFAMETHOXAZOLE-TRIMETHOPRIM 800-160 MG PO TABS: 1.0000 | ORAL_TABLET | Freq: Every day | ORAL | 0 refills | Status: DC

## 2018-09-10 MED ORDER — ALBUTEROL SULFATE HFA 108 (90 BASE) MCG/ACT IN AERS: 1.0000 | INHALATION_SPRAY | Freq: Four times a day (QID) | RESPIRATORY_TRACT | 3 refills | Status: DC | PRN

## 2018-09-10 MED FILL — PROVENTIL HFA 108 (90 BASE): 108 (90 BAS | 25 days supply | Qty: 7 | Fill #0

## 2018-09-10 NOTE — Patient Instructions (Addendum)
Thank you for allowing [name] to care for you  For your urinary symptoms - We are checking your urine for signs of infection - We will call in an antibiotic for you if needed (Prescription given, AVS printed before dipstick results)   Please keep appointment with financial counselor  Please follow up once you have obtained financial assistance or sooner if needed

## 2018-09-10 NOTE — Progress Notes (Signed)
   CC: Establish Care, urinary frequency, R flank pain, Asthma  HPI:  [name] is a 22 y.o. F with PMHx listed below presenting for Establish Care, urinary frequency, R flank pain, Asthma. Please see the A&P for the status of the patient's chronic medical problems.   Past Medical History:  Diagnosis Date  . Asthma    Dr. Donneta Romberg  . Club foot 1996   Congenital  . Hyperlipidemia    Past Surgical History:  Procedure Laterality Date  . CLUB FOOT RELEASE  3267   @ 93 days old  . KNEE SURGERY    . LAPAROSCOPIC APPENDECTOMY N/A 06/04/2017   Procedure: APPENDECTOMY LAPAROSCOPIC;  Surgeon: Erroll Luna, MD;  Location: [facility] OR;  Service: General;  Laterality: N/A;   Family History  Problem Relation Age of Onset  . Diabetes Father   . Diabetes Paternal Uncle   . Diabetes Paternal Grandmother    Social History   Socioeconomic History  . Marital status: Married    Spouse name: Not on file  . Number of children: Not on file  . Years of education: Not on file  . Highest education level: Not on file  Occupational History  . Not on file  Social Needs  . Financial resource strain: Not on file  . Food insecurity:    Worry: Not on file    Inability: Not on file  . Transportation needs:    Medical: Not on file    Non-medical: Not on file  Tobacco Use  . Smoking status: Never Smoker  . Smokeless tobacco: Never Used  Substance and Sexual Activity  . Alcohol use: No  . Drug use: No  . Sexual activity: Not on file  Lifestyle  . Physical activity:    Days per week: Not on file    Minutes per session: Not on file  . Stress: Not on file  Relationships  . Social connections:    Talks on phone: Not on file    Gets together: Not on file    Attends religious service: Not on file    Active member of club or organization: Not on file    Attends meetings of clubs or organizations: Not on file    Relationship status: Not on file  Other Topics Concern  . Not on file    Social History Narrative  . Not on file   Review of Systems:  Performed and all others negative.  Physical Exam:  Vitals:   09/10/18 1522  BP: 122/70  Pulse: 72  Temp: 98.6 F (37 C)  TempSrc: Oral  SpO2: 100%  Weight: 213 lb 11.2 oz (96.9 kg)  Height: 5' (1.524 m)   Physical Exam  Constitutional: She appears well-developed and well-nourished. No distress.  Cardiovascular: Normal rate and intact distal pulses.  Pulmonary/Chest: Effort normal. No respiratory distress.  Abdominal: Soft. She exhibits no distension.  Musculoskeletal: She exhibits no edema or deformity.  Mild R flank tenderness  Skin: Skin is warm and dry.  Psychiatric: She has a normal mood and affect.   Assessment & Plan:   See Encounters Tab for problem based charting.  Patient discussed with Dr. Dareen Piano

## 2018-09-10 NOTE — Assessment & Plan Note (Signed)
Patient states she has a history of problems with her urinary sphincter with some incontinence. This sounds most consistent with sphincter incompetence, but we will obtain records from [facility] urology for further information - Request Records from [facility]

## 2018-09-10 NOTE — Assessment & Plan Note (Signed)
Patient state she has a history of elevated cholesterol, but has not had this checked in some time and their are no lipid panel results in her chart. Will consider Lipid panel when able to better afford this if records cannot be obtained.

## 2018-09-10 NOTE — Assessment & Plan Note (Signed)
Patient reports urinary frequency, dysuria, and some change in urine odor. She states this is similar to previous UTI's she has had and she has tried increased water and cranberry juice intake without resolution of symptoms. Urine Dipstick shows Trace blood and small Leukocytes. Will treat for UTI given symptoms. Further lab work-up deferred due to cost - Bactrim DS Daily x 5 days

## 2018-09-10 NOTE — Assessment & Plan Note (Signed)
Patient has a history of asthma and has used albuterol inhaler in the past. She reports symptoms most frequently occur with exesion. This is most consistent with exercise-induced asthma. She has not had an inhaler for some time due to cost. - Albuterol Sample Provided

## 2018-09-10 NOTE — Assessment & Plan Note (Signed)
Patient reports a history of right flank pain. She states it has occurred in the past when she has UTI's, but the [facility] had recommended an ultrasound to evaluate for ovarian cysts. She has not had an ultrasound yet due to her lack of insurance. When she is able to obtain insurance/assistance an ultrasound may be warranted if symptoms persist after treating her UTI. - Monitor

## 2018-09-12 NOTE — Progress Notes (Signed)
Internal Medicine Clinic Attending  Case discussed with Dr. Melvin  at the time of the visit.  We reviewed the resident's history and exam and pertinent patient test results.  I agree with the assessment, diagnosis, and plan of care documented in the resident's note.  

## 2018-09-17 ENCOUNTER — Ambulatory Visit: Payer: Medicaid Other

## 2018-09-19 ENCOUNTER — Telehealth: Payer: Self-pay | Admitting: *Deleted

## 2018-09-19 ENCOUNTER — Encounter: Payer: Self-pay | Admitting: Internal Medicine

## 2018-09-19 NOTE — Telephone Encounter (Signed)
Call to [facility].  PA not needed for ProAir as patient can pick up for $4.  Patient has not picked upe the prescription and medication will be returned to shelf.  Call to patient ot inform her that her prescription is waiting.  Stated is awaiting transportation to get to the pharmacy to pick up.  Sander Nephew, RN 09/19/2018 10:06 AM.

## 2018-09-26 ENCOUNTER — Ambulatory Visit: Payer: Medicaid Other

## 2018-11-02 ENCOUNTER — Encounter (INDEPENDENT_AMBULATORY_CARE_PROVIDER_SITE_OTHER): Payer: Self-pay

## 2018-11-02 ENCOUNTER — Ambulatory Visit: Payer: Medicaid Other

## 2018-11-19 ENCOUNTER — Ambulatory Visit (HOSPITAL_COMMUNITY)
Admission: RE | Admit: 2018-11-19 | Discharge: 2018-11-19 | Disposition: A | Discharge status: Home / Self Care | Payer: Medicaid Other | Source: Ambulatory Visit | Attending: Internal Medicine | Admitting: Internal Medicine

## 2018-11-19 ENCOUNTER — Other Ambulatory Visit: Payer: Self-pay

## 2018-11-19 ENCOUNTER — Encounter: Payer: Self-pay | Admitting: Internal Medicine

## 2018-11-19 ENCOUNTER — Ambulatory Visit (INDEPENDENT_AMBULATORY_CARE_PROVIDER_SITE_OTHER): Payer: Self-pay | Admitting: Internal Medicine

## 2018-11-19 DIAGNOSIS — M545 Low back pain, unspecified: Secondary | ICD-10-CM

## 2018-11-19 DIAGNOSIS — G8929 Other chronic pain: Secondary | ICD-10-CM | POA: Insufficient documentation

## 2018-11-19 DIAGNOSIS — M549 Dorsalgia, unspecified: Secondary | ICD-10-CM | POA: Insufficient documentation

## 2018-11-19 DIAGNOSIS — S300XXA Contusion of lower back and pelvis, initial encounter: Secondary | ICD-10-CM

## 2018-11-19 LAB — HCG, SERUM, QUALITATIVE: Preg, Serum: NEGATIVE

## 2018-11-19 MED ORDER — CYCLOBENZAPRINE HCL 5 MG PO TABS: 5.0000 mg | ORAL_TABLET | Freq: Three times a day (TID) | ORAL | 0 refills | Status: DC | PRN

## 2018-11-19 NOTE — Progress Notes (Signed)
   CC: Acute on chronic back pain   HPI:  [name] is a 23 y.o. year-old female with PMH listed below who presents to clinic for acute on chronic back pain. Please see problem based assessment and plan for further details.   Past Medical History:  Diagnosis Date  . Asthma    Dr. Donneta Romberg  . Club foot 1996   Congenital  . Hyperlipidemia    Review of Systems:   Review of Systems  Musculoskeletal: Positive for back pain and myalgias. Negative for falls.  Neurological: Positive for sensory change. Negative for dizziness, focal weakness and headaches.    Physical Exam: Vitals:   11/19/18 1538  BP: 136/71  Pulse: 73  Temp: 98.4 F (36.9 C)  TempSrc: Oral  SpO2: 99%  Weight: 217 lb 8 oz (98.7 kg)  Height: 5' (1.524 m)    General: Young, well-appearing female no acute distress Cardiac: regular rate and rhythm, nl S1/S2, no murmurs, rubs or gallops Pulm: CTAB, no wheezes or crackles, no increased work of breathing on room air  Neuro: A&Ox3, CN II-XII intact, sensation intact in all four extremities, no motor deficits, nl finger-to-nose testing, nl heel-to shin, nl gait Ext: warm and well perfused, no peripheral edema Derm: mild bruising over lower back      Assessment & Plan:   See Encounters Tab for problem based charting.  Patient discussed with Dr. Angelia Mould
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
## 2018-11-19 NOTE — Patient Instructions (Addendum)
Meghan Riggs,   I ordered a lumbar spine x-ray to evaluate your back pain.  I also sent a prescription for Flexeril to your pharmacy.  You can take 1 tablet 3 times a day as needed for pain.  This medication can cause dizziness and make you sleepy.  Avoid any dangerous activities while taking this medication.  We will give you a call with the results of your x-rays.  If your pain continues to worsen after you finish the course of Flexeril please give [name] a call and schedule an appointment for further evaluation.  -Dr. Frederico Hamman

## 2018-11-20 ENCOUNTER — Telehealth: Payer: Self-pay | Admitting: Internal Medicine

## 2018-11-20 NOTE — Telephone Encounter (Signed)
Returned calls. Discussed results. Thank you!

## 2018-11-20 NOTE — Telephone Encounter (Signed)
Pt checking on results from yesterday; pls contact [phone]

## 2018-11-23 NOTE — Progress Notes (Signed)
Internal Medicine Clinic Attending  Case discussed with Dr. Santos-Sanchez at the time of the visit.  We reviewed the resident's history and exam and pertinent patient test results.  I agree with the assessment, diagnosis, and plan of care documented in the resident's note.    

## 2018-11-26 NOTE — Addendum Note (Signed)
Addended by: Welford Roche on: 11/26/2018 07:40 AM   Modules accepted: Orders

## 2018-12-04 ENCOUNTER — Encounter: Payer: Self-pay | Admitting: Internal Medicine

## 2019-03-01 ENCOUNTER — Other Ambulatory Visit: Payer: Self-pay

## 2019-03-01 ENCOUNTER — Telehealth: Payer: Self-pay

## 2019-03-01 ENCOUNTER — Inpatient Hospital Stay (HOSPITAL_COMMUNITY)
Admission: AD | Admit: 2019-03-01 | Discharge: 2019-03-01 | Disposition: A | Discharge status: Home / Self Care | Payer: Medicaid Other | Attending: Family Medicine | Admitting: Family Medicine

## 2019-03-01 DIAGNOSIS — N939 Abnormal uterine and vaginal bleeding, unspecified: Secondary | ICD-10-CM | POA: Insufficient documentation

## 2019-03-01 DIAGNOSIS — Z3202 Encounter for pregnancy test, result negative: Secondary | ICD-10-CM | POA: Insufficient documentation

## 2019-03-01 LAB — POCT PREGNANCY, URINE: Preg Test, Ur: NEGATIVE

## 2019-03-01 NOTE — Telephone Encounter (Signed)
Returned pt's call - stated her last period was 6 weeks ago and Monday she started spotting. On Tuesday when she took her tampon out; there was something attached to it and it was not a clot. She's concern about having a miscarriage. And stated her pds are usually heavy but now it's light. Suggested pt to go to [facility] ED; she's concern that she only have the orange card. I checked w/Neka - pt has the caffa letter- informed pt so she can go to [employer].

## 2019-03-01 NOTE — MAU Note (Addendum)
Started spotting on Monday.  Had been 6 wks since last period. Negative UPT 2  wks prior.  Pains were so bad on Tues night, she could hardly do anything. Saw a tadpole looking thing attached to the tampon, not a normal clot.  Has continued to have pain.  Is hardly bleeding and that is not normal for her. Her cycles are usually very heavy and has cramping, but this is different.  Uses nuvaring

## 2019-03-01 NOTE — Telephone Encounter (Signed)
Requesting to speak with a nurse about vaginal bleeding. Please call back.

## 2019-03-01 NOTE — MAU Provider Note (Signed)
First Provider Initiated Contact with Patient 03/01/19 1618      S [name] is a 23 y.o. presenting for possible pregnancy. Reports no menses in 6 weeks then started bleeding 4 days ago and passed some tissue. She is concerned this is a miscarriage. She took a pregnancy test which was negative. Bleeding has decreased. She is using Nuvaring for contraception.  O BP 127/80 (BP Location: Right Arm)   Pulse 73   Temp 98.5 F (36.9 C) (Oral)   Resp 18   Ht 5' (1.524 m)   Wt 94.8 kg   LMP 01/04/2019   SpO2 100%   BMI 40.84 kg/m  Physical Exam  Nursing note and vitals reviewed. Constitutional: She is oriented to person, place, and time. She appears well-developed and well-nourished. No distress.  HENT:  Head: Normocephalic and atraumatic.  Neck: Normal range of motion.  Cardiovascular: Normal rate.  Respiratory: Effort normal. No respiratory distress.  Musculoskeletal: Normal range of motion.  Neurological: She is alert and oriented to person, place, and time.  Psychiatric: She has a normal mood and affect.   Results for orders placed or performed during the hospital encounter of 03/01/19 (from the past 24 hour(s))  Pregnancy, urine POC     Status: None   Collection Time: 03/01/19  3:49 PM  Result Value Ref Range   Preg Test, Ur NEGATIVE NEGATIVE   MDM: Pt showed a picture of the tissue which appears to be endometrial tissue.  A Non pregnant female Medical screening exam complete  P Discharge from MAU in stable condition Follow up at [facility] as needed List of other options for follow-up given  Warning signs for worsening condition that would warrant emergency follow-up discussed Patient may return to MAU as needed for pregnancy related complaints  Julianne Handler, [place] 03/01/2019 4:26 PM

## 2019-03-01 NOTE — Telephone Encounter (Signed)
Agree  Thank you

## 2019-07-02 ENCOUNTER — Other Ambulatory Visit: Payer: Self-pay

## 2019-07-02 DIAGNOSIS — Z20822 Contact with and (suspected) exposure to covid-19: Secondary | ICD-10-CM

## 2019-07-03 LAB — NOVEL CORONAVIRUS, NAA: SARS-CoV-2, NAA: NOT DETECTED

## 2019-07-04 ENCOUNTER — Telehealth: Payer: Self-pay

## 2019-07-04 NOTE — Telephone Encounter (Signed)
Pt. Called back and given COVID 19 results.

## 2019-08-05 ENCOUNTER — Other Ambulatory Visit: Payer: Self-pay | Admitting: *Deleted

## 2019-08-05 DIAGNOSIS — Z20822 Contact with and (suspected) exposure to covid-19: Secondary | ICD-10-CM

## 2019-08-06 LAB — NOVEL CORONAVIRUS, NAA: SARS-CoV-2, NAA: NOT DETECTED

## 2019-08-19 ENCOUNTER — Telehealth: Payer: Self-pay | Admitting: Internal Medicine

## 2019-08-19 NOTE — Telephone Encounter (Signed)
Pt believes she was exposed to COVID-19.  Pt having COughing, Runny nose, Congestion, Fever,  Head Pressure and Sore throat.  Patient requesting a call back as soon as possible.  Pt states she is very concerned because she has a 24 year old child who is also showing symptoms.

## 2019-08-19 NOTE — Telephone Encounter (Signed)
RTC, pt states she believes she was exposed to Covid and started having symptoms on Saturday.  Pt states she has been coughing, congestion, headache, and sore throat, as well as her 24 year old son and SO.  Pt denies any SOB, difficulty breathing, or wheezing.  Pt states she presented to [facility] this afternoon after 3pm for testing and was turned away because the testing site was closing.  Pt wanting testing today if possible.  Informed pt since it is late in the day, most community sites have closed, but to call [facility] for their testing hours.  Pt states she does not have insurance and does not want to pay for testing.  Pt informed if she, son, or SO develop any difficulty breathing they should report to ER, but otherwise testing may not be available until tomorrow at testing site.  Also instructed pt to quarantine until COVID results are back.  Will forward to PCP to advise on Covid orders and/or telehealth visit.

## 2019-08-20 ENCOUNTER — Other Ambulatory Visit: Payer: Self-pay

## 2019-08-20 DIAGNOSIS — Z20822 Contact with and (suspected) exposure to covid-19: Secondary | ICD-10-CM

## 2019-08-20 NOTE — Telephone Encounter (Signed)
Patient can get testing today at [facility] if she has not been tested yet. Do they need an order from me? My understanding is that you just go through the drive thru (at least that's how it was in July when I had to get tested). She can also go to the [employer] and find numerous places for testing listed by city. I just did this over the weekend and found dozens of places. [facility] is also offering free tests for uninsured people. I dont think she needs telehealth unless she is having respiratory symptoms.

## 2019-08-20 NOTE — Telephone Encounter (Signed)
Attempted to call pt, lm for rtc 

## 2019-08-21 LAB — NOVEL CORONAVIRUS, NAA: SARS-CoV-2, NAA: NOT DETECTED

## 2019-08-27 NOTE — Telephone Encounter (Signed)
Spoke w/ pt, she states she is about the same but does not want an appt because its 20.00 and maybe its got to run its course

## 2019-11-25 ENCOUNTER — Other Ambulatory Visit: Payer: Self-pay

## 2019-11-25 ENCOUNTER — Emergency Department (HOSPITAL_COMMUNITY): Payer: Self-pay

## 2019-11-25 ENCOUNTER — Encounter (HOSPITAL_COMMUNITY): Payer: Self-pay | Admitting: Emergency Medicine

## 2019-11-25 ENCOUNTER — Emergency Department (HOSPITAL_COMMUNITY)
Admission: EM | Admit: 2019-11-25 | Discharge: 2019-11-25 | Disposition: A | Discharge status: Home / Self Care | Payer: Self-pay | Attending: Emergency Medicine | Admitting: Emergency Medicine

## 2019-11-25 DIAGNOSIS — Z793 Long term (current) use of hormonal contraceptives: Secondary | ICD-10-CM | POA: Insufficient documentation

## 2019-11-25 DIAGNOSIS — K2900 Acute gastritis without bleeding: Secondary | ICD-10-CM | POA: Insufficient documentation

## 2019-11-25 DIAGNOSIS — J45909 Unspecified asthma, uncomplicated: Secondary | ICD-10-CM | POA: Insufficient documentation

## 2019-11-25 DIAGNOSIS — F17210 Nicotine dependence, cigarettes, uncomplicated: Secondary | ICD-10-CM | POA: Insufficient documentation

## 2019-11-25 LAB — COMPREHENSIVE METABOLIC PANEL
ALT: 63 U/L — ABNORMAL HIGH (ref 0–44)
AST: 55 U/L — ABNORMAL HIGH (ref 15–41)
Albumin: 4.4 g/dL (ref 3.5–5.0)
Alkaline Phosphatase: 69 U/L (ref 38–126)
Anion gap: 15 (ref 5–15)
BUN: 8 mg/dL (ref 6–20)
CO2: 18 mmol/L — ABNORMAL LOW (ref 22–32)
Calcium: 9.7 mg/dL (ref 8.9–10.3)
Chloride: 108 mmol/L (ref 98–111)
Creatinine, Ser: 0.82 mg/dL (ref 0.44–1.00)
GFR calc Af Amer: >60 mL/min (ref >60)
GFR calc non Af Amer: >60 mL/min (ref >60)
Glucose, Bld: 127 mg/dL — ABNORMAL HIGH (ref 70–99)
Potassium: 3.9 mmol/L (ref 3.5–5.1)
Sodium: 141 mmol/L (ref 135–145)
Total Bilirubin: 0.6 mg/dL (ref 0.3–1.2)
Total Protein: 7.7 g/dL (ref 6.5–8.1)

## 2019-11-25 LAB — URINALYSIS, ROUTINE W REFLEX MICROSCOPIC
Bacteria, UA: NONE SEEN
Bilirubin Urine: NEGATIVE
Glucose, UA: NEGATIVE mg/dL
Hgb urine dipstick: NEGATIVE
Ketones, ur: NEGATIVE mg/dL
Nitrite: NEGATIVE
Protein, ur: 30 mg/dL — AB
Specific Gravity, Urine: 1.029 (ref 1.005–1.030)
pH: 6 (ref 5.0–8.0)

## 2019-11-25 LAB — CBC
HCT: 43.1 % (ref 36.0–46.0)
Hemoglobin: 14.9 g/dL (ref 12.0–15.0)
MCH: 31.2 pg (ref 26.0–34.0)
MCHC: 34.6 g/dL (ref 30.0–36.0)
MCV: 90.4 fL (ref 80.0–100.0)
Platelets: 399 10*3/uL (ref 150–400)
RBC: 4.77 MIL/uL (ref 3.87–5.11)
RDW: 11.9 % (ref 11.5–15.5)
WBC: 17.9 10*3/uL — ABNORMAL HIGH (ref 4.0–10.5)
nRBC: 0 % (ref 0.0–0.2)

## 2019-11-25 LAB — I-STAT BETA HCG BLOOD, ED (MC, WL, AP ONLY): I-stat hCG, quantitative: <5 m[IU]/mL (ref <5)

## 2019-11-25 LAB — LIPASE, BLOOD: Lipase: 25 U/L (ref 11–51)

## 2019-11-25 MED ORDER — SODIUM CHLORIDE 0.9 % IV BOLUS
1000.0000 mL | Freq: Once | INTRAVENOUS | Status: AC
  Administered 2019-11-25: 1000 mL via INTRAVENOUS

## 2019-11-25 MED ORDER — ONDANSETRON HCL 4 MG/2ML IJ SOLN
4.0000 mg | Freq: Once | INTRAMUSCULAR | Status: AC
  Administered 2019-11-25: 4 mg via INTRAVENOUS
  Filled 2019-11-25: qty 2

## 2019-11-25 MED ORDER — SODIUM CHLORIDE 0.9 % IV BOLUS
1000.0000 mL | Freq: Once | INTRAVENOUS | Status: AC
  Administered 2019-11-25: 03:00:00 1000 mL via INTRAVENOUS

## 2019-11-25 MED ORDER — FAMOTIDINE IN NACL 20-0.9 MG/50ML-% IV SOLN
20.0000 mg | Freq: Once | INTRAVENOUS | Status: AC
  Administered 2019-11-25: 20 mg via INTRAVENOUS
  Filled 2019-11-25: qty 50

## 2019-11-25 MED ORDER — SODIUM CHLORIDE 0.9% FLUSH
3.0000 mL | Freq: Once | INTRAVENOUS | Status: AC
  Administered 2019-11-25: 3 mL via INTRAVENOUS

## 2019-11-25 MED ORDER — LIDOCAINE VISCOUS HCL 2 % MT SOLN
15.0000 mL | Freq: Once | OROMUCOSAL | Status: AC
  Administered 2019-11-25: 15 mL via ORAL
  Filled 2019-11-25: qty 15

## 2019-11-25 MED ORDER — ALUM & MAG HYDROXIDE-SIMETH 200-200-20 MG/5ML PO SUSP
30.0000 mL | Freq: Once | ORAL | Status: AC
  Administered 2019-11-25: 30 mL via ORAL
  Filled 2019-11-25: qty 30

## 2019-11-25 MED ORDER — PANTOPRAZOLE SODIUM 40 MG PO TBEC: 40.0000 mg | DELAYED_RELEASE_TABLET | Freq: Every day | ORAL | 3 refills | Status: DC

## 2019-11-25 NOTE — ED Notes (Addendum)
Pt wheeled to ED RM 34 from [facility]. Pt is A&Ox4, in NAD. VSS on continuous monitors. Breathing easy, non-labored. Pt c/o N/V since 1630 after having 3 mixed alcoholic drinks after a funeral. Pt states she cannot keep anything down. Also endorses LUQ abd pain.  Pt denies everyday ETOH use. States she drank last Friday through Monday in [place] for her birthday. Denies blood in emesis. x1 emesis upon arrival to room.

## 2019-11-25 NOTE — ED Notes (Signed)
Patient verbalizes understanding of discharge instructions. Opportunity for questioning and answers were provided. All questions answered completely. PIV removed, catheter intact. Site dressed with gauze and tape Armband removed by staff, pt discharged from ED. Ambulatory with strong, steady gait

## 2019-11-25 NOTE — ED Provider Notes (Signed)
[facility] EMERGENCY DEPARTMENT Provider Note   CSN: [id number] Arrival date & time: 11/25/19  0202     History Chief Complaint  Patient presents with  . Emesis    Meghan Riggs is a 24 y.o. female.  Patient presents to the emergency department for evaluation of abdominal pain with nausea and vomiting.  Symptoms began this evening after drinking 3 mixed drinks.  She does not drink daily or have a history of alcoholism.        Past Medical History:  Diagnosis Date  . Asthma    Dr. Donneta Romberg  . Club foot 1996   Congenital  . Hyperlipidemia     Patient Active Problem List   Diagnosis Date Noted  . Back pain 11/19/2018  . Asthma 09/10/2018  . Hyperlipidemia 09/10/2018  . Right flank pain 09/10/2018  . Urinary tract infection with hematuria 09/10/2018  . 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 APPENDECTOMY    . CLUB FOOT RELEASE  4062   @ 75 days old  . KNEE SURGERY    . LAPAROSCOPIC APPENDECTOMY N/A 06/04/2017   Procedure: APPENDECTOMY LAPAROSCOPIC;  Surgeon: Erroll Luna, MD;  Location: [facility];  Service: General;  Laterality: N/A;     OB History   No obstetric history on file.     Family History  Problem Relation Age of Onset  . Diabetes Father   . Brain cancer Father   . Diabetes Paternal Uncle   . Diabetes Paternal Grandmother   . COPD Mother   . Asthma Mother   . Cervical cancer Mother   . Interstitial cystitis Mother   . Graves' disease Mother     Social History   Tobacco Use  . Smoking status: Light Tobacco Smoker    Types: Cigarettes  . Smokeless tobacco: Never Used  . Tobacco comment: pack/week  Substance Use Topics  . Alcohol use: Yes  . Drug use: No    Home Medications Prior to Admission medications   Medication Sig Start Date End Date Taking? Authorizing Provider  albuterol (PROVENTIL HFA;VENTOLIN HFA) 108 (90 Base) MCG/ACT inhaler Inhale 1-2  puffs into the lungs every 6 (six) hours as needed for wheezing or shortness of breath. 09/10/18   Neva Seat, MD  cyclobenzaprine (FLEXERIL) 5 MG tablet Take 1 tablet (5 mg total) by mouth 3 (three) times daily as needed for muscle spasms. 11/19/18   Welford Roche, MD  etonogestrel-ethinyl estradiol (NUVARING) 0.12-0.015 MG/24HR vaginal ring Place 1 each vaginally See admin instructions. Insert vaginally and leave in place for 24 days then puts a new one in on the same day for 24 days and then takes out for four days.    [provider]  pantoprazole (PROTONIX) 40 MG tablet Take 1 tablet (40 mg total) by mouth daily. 11/25/19   Orpah Greek, MD    Allergies    Patient has no known allergies.  Review of Systems   Review of Systems  Constitutional: Positive for diaphoresis.  Gastrointestinal: Positive for abdominal pain, nausea and vomiting.  All other systems reviewed and are negative.   Physical Exam Updated Vital Signs BP 108/73   Pulse 79   Temp 98 F (36.7 C) (Oral)   Resp (!) 21   LMP 11/18/2019   SpO2 94%   Physical Exam Vitals and nursing note reviewed.  Constitutional:      General: She is not in acute distress.  Appearance: Normal appearance. She is well-developed. She is diaphoretic.  HENT:     Head: Normocephalic and atraumatic.     Right Ear: Hearing normal.     Left Ear: Hearing normal.     Nose: Nose normal.  Eyes:     Conjunctiva/sclera: Conjunctivae normal.     Pupils: Pupils are equal, round, and reactive to light.  Cardiovascular:     Rate and Rhythm: Regular rhythm.     Heart sounds: S1 normal and S2 normal. No murmur. No friction rub. No gallop.   Pulmonary:     Effort: Pulmonary effort is normal. No respiratory distress.     Breath sounds: Normal breath sounds.  Chest:     Chest wall: No tenderness.  Abdominal:     General: Bowel sounds are normal.     Palpations: Abdomen is soft.     Tenderness: There is  abdominal tenderness in the right upper quadrant, epigastric area and left upper quadrant. There is no guarding or rebound. Negative signs include Murphy's sign and McBurney's sign.     Hernia: No hernia is present.  Musculoskeletal:        General: Normal range of motion.     Cervical back: Normal range of motion and neck supple.  Skin:    General: Skin is warm.     Findings: No rash.  Neurological:     Mental Status: She is alert and oriented to person, place, and time.     GCS: GCS eye subscore is 4. GCS verbal subscore is 5. GCS motor subscore is 6.     Cranial Nerves: No cranial nerve deficit.     Sensory: No sensory deficit.     Coordination: Coordination normal.  Psychiatric:        Speech: Speech normal.        Behavior: Behavior normal.        Thought Content: Thought content normal.     ED Results / Procedures / Treatments   Labs (all labs ordered are listed, but only abnormal results are displayed) Labs Reviewed  COMPREHENSIVE METABOLIC PANEL - Abnormal; Notable for the following components:      Result Value   CO2 18 (*)    Glucose, Bld 127 (*)    AST 55 (*)    ALT 63 (*)    All other components within normal limits  CBC - Abnormal; Notable for the following components:   WBC 17.9 (*)    All other components within normal limits  URINALYSIS, ROUTINE W REFLEX MICROSCOPIC - Abnormal; Notable for the following components:   Color, Urine AMBER (*)    APPearance TURBID (*)    Protein, ur 30 (*)    Leukocytes,Ua TRACE (*)    All other components within normal limits  LIPASE, BLOOD  I-STAT BETA HCG BLOOD, ED ([facility], [facility], [facility] ONLY)    EKG None  Radiology US ABDOMEN LIMITED RUQ  Result Date: 11/25/2019 CLINICAL DATA:  Upper abdominal pain and vomiting. EXAM: ULTRASOUND ABDOMEN LIMITED RIGHT UPPER QUADRANT COMPARISON:  CT 06/04/2017.  Ultrasound 10/16/2015. FINDINGS: Gallbladder: Physiologically distended. No gallstones or wall thickening visualized. No sonographic  Murphy sign noted by sonographer. Common bile duct: Diameter: 3 mm. Liver: No focal lesion identified. Diffusely increased in parenchymal echogenicity. Portal vein is patent on color Doppler imaging with normal direction of blood flow towards the liver. Other: None. IMPRESSION: 1. Normal sonographic appearance of the gallbladder and biliary tree. 2. Hepatic steatosis. Electronically Signed   By: Threasa Beards  Sanford M.D.   On: 11/25/2019 04:30    Procedures Procedures (including critical care time)  Medications Ordered in ED Medications  sodium chloride flush (NS) 0.9 % injection 3 mL (3 mLs Intravenous Given 11/25/19 0236)  sodium chloride 0.9 % bolus 1,000 mL (1,000 mLs Intravenous New Bag/Given 11/25/19 0235)  ondansetron (ZOFRAN) injection 4 mg (4 mg Intravenous Given 11/25/19 0244)  famotidine (PEPCID) IVPB 20 mg premix (20 mg Intravenous New Bag/Given 11/25/19 0529)  alum & mag hydroxide-simeth (MAALOX/MYLANTA) 200-200-20 MG/5ML suspension 30 mL (30 mLs Oral Given 11/25/19 0518)    And  lidocaine (XYLOCAINE) 2 % viscous mouth solution 15 mL (15 mLs Oral Given 11/25/19 0518)  sodium chloride 0.9 % bolus 1,000 mL (1,000 mLs Intravenous New Bag/Given 11/25/19 0519)    ED Course  I have reviewed the triage vital signs and the nursing notes.  Pertinent labs & imaging results that were available during my care of the patient were reviewed by me and considered in my medical decision making (see chart for details).    MDM Rules/Calculators/A&P                      Patient presents to the emergency department for evaluation of abdominal pain with nausea and vomiting.  Symptoms began this evening.  She reports that she [place] to onset of symptoms.  She has not been able to hold anything down over the last several hours.  Patient complaining of diffuse upper abdominal pain.  She does have tenderness across the upper abdomen without lower abdominal pain or tenderness.   No guarding or rebound.  Patient very slight elevation of transaminases with a leukocytosis.  Patient underwent ultrasound of gallbladder and there is no abnormality noted.  She is feeling much better after IV fluids and Zofran.  She still has some burning in the upper abdomen.  This improved further with Pepcid and GI cocktail.  She has now tolerating oral intake without difficulty and appears well.  I do not feel that she requires any further work-up at this time, will continue PPI, follow-up with PCP, may need GI follow-up if symptoms continue. Final Clinical Impression(s) / ED Diagnoses Final diagnoses:  Acute gastritis without hemorrhage, unspecified gastritis type    Rx / DC Orders ED Discharge Orders         Ordered    pantoprazole (PROTONIX) 40 MG tablet  Daily     11/25/19 0611           Orpah Greek, MD 11/25/19 [phone]

## 2019-11-25 NOTE — ED Notes (Signed)
Pt ambulatory to and from BR in NAD with strong, steady gait. Urine collected, labeled with 2 pt identifiers, and sent to lab All meds given per [facility]. Name/DOB verified with pt

## 2019-11-25 NOTE — ED Notes (Signed)
Pt taken to US in NAD

## 2019-11-25 NOTE — ED Triage Notes (Signed)
Pt c/o emesis, abd pain, diaphoresis, tremors since drinking 3 mixed drinks today @ 1630.

## 2019-11-25 NOTE — Discharge Instructions (Addendum)
Follow-up with your primary care provider for recheck.  They may need to refer you to a gastroenterologist if symptoms persist.

## 2020-01-01 ENCOUNTER — Telehealth: Payer: Self-pay

## 2020-01-01 ENCOUNTER — Encounter: Payer: Self-pay | Admitting: Internal Medicine

## 2020-01-01 ENCOUNTER — Ambulatory Visit: Payer: Self-pay | Admitting: Internal Medicine

## 2020-01-01 VITALS — BP 121/55 | HR 68 | Temp 98.0°F | Ht 61.0 in | Wt 215.7 lb

## 2020-01-01 DIAGNOSIS — N912 Amenorrhea, unspecified: Secondary | ICD-10-CM

## 2020-01-01 DIAGNOSIS — R12 Heartburn: Secondary | ICD-10-CM

## 2020-01-01 LAB — GLUCOSE, CAPILLARY: Glucose-Capillary: 82 mg/dL (ref 70–99)

## 2020-01-01 LAB — POCT GLYCOSYLATED HEMOGLOBIN (HGB A1C): Hemoglobin A1C: 5 % (ref 4.0–5.6)

## 2020-01-01 LAB — POCT URINE PREGNANCY: Preg Test, Ur: POSITIVE — AB

## 2020-01-01 NOTE — Telephone Encounter (Signed)
Pt was unable to wait for A1C results after office visit today and is requesting call-back with results per Olivia Mackie in the lab.  TC to patient, notified A1C was normal.  Dr. Laurin Coder notified. SChaplin, RN,BSN

## 2020-01-01 NOTE — Patient Instructions (Addendum)
Ms. Maners,   Congratulations on your pregnancy!!! Please start taking prenatal vitamins and folic acid today. Continue taking these every day.  Please make up to schedule an appointment with an obstetrician as soon as possible.  For your reflux you can take Tums.  These are safe during pregnancy.  Please avoid cat litter, fish, uncooked meats, deli meat, tobacco use and alcohol. Please limit your caffeine intake to 1 small cup per day.   Please make sure to ask your doctor if medications are safe to take before taking a new medicines.  -Dr. Frederico Hamman

## 2020-01-02 ENCOUNTER — Encounter: Payer: Self-pay | Admitting: Internal Medicine

## 2020-01-02 DIAGNOSIS — N912 Amenorrhea, unspecified: Secondary | ICD-10-CM | POA: Insufficient documentation

## 2020-01-02 NOTE — Progress Notes (Signed)
Internal Medicine Clinic Attending  Case discussed with Dr. Santos-Sanchez at the time of the visit.  We reviewed the resident's history and exam and pertinent patient test results.  I agree with the assessment, diagnosis, and plan of care documented in the resident's note.    

## 2020-01-02 NOTE — Assessment & Plan Note (Signed)
Meghan Riggs presents requesting a pregnancy test. She is sexually active with her fiance of 5 years and does not use protection. LMP 11/14/2019. She describes it as light and short which is unusual for her. She has fairly regular menstrual cycles every 28-30 days but missed her period this month. She did 2 HPT that were positive but would like to confirmatory one in our office today. UPT was positive. This is unplanned but her and her fiance are very excited about it. Unclear how far along she is, suspect at least 7 weeks.   - Strongly recommended she establishes with an OB as soon as possible  - Start prenatal vitamins and folic acid  - Reviewed medication list, STOP Protonix,. Recommended TUMS for GERD. Advised to call if she has any questions on what she can take  - Counseled against use of alcohol, tobacco, drugs, and herbal supplements  - Counseled against consumption of raw meats, deli meats, fish, shellfish, unpasteurized foods, unwashed fruits -  She is a coffee drinker, counseled on limiting caffeine intake to one small 8 oz cup per day  - Has a cat at home but no litter box, advised against cleaning litter box if she were to get one  - She has a history of asthma which can worsen during pregnancy, advise her to call us if she experiences any respiratory symptoms

## 2020-01-02 NOTE — Progress Notes (Signed)
   CC: Amenorrhea  HPI:  [name] is a 24 y.o. year-old female with PMH listed below who presents to clinic for amenorrhea. Please see problem based assessment and plan for further details.   Past Medical History:  Diagnosis Date  . Asthma    Dr. Donneta Romberg  . Club foot 1996   Congenital  . Hyperlipidemia    Review of Systems:   Review of Systems  Constitutional: Negative for chills, fever, malaise/fatigue and weight loss.  Respiratory: Negative for shortness of breath.   Cardiovascular: Negative for chest pain, palpitations and leg swelling.  Gastrointestinal: Positive for heartburn. Negative for abdominal pain, constipation, diarrhea, nausea and vomiting.  Genitourinary: Negative for dysuria, frequency, hematuria and urgency.  Neurological: Negative for dizziness and headaches.  Psychiatric/Behavioral: Negative for depression.    Physical Exam:  Vitals:   01/01/20 1450  BP: (!) 121/55  Pulse: 68  Temp: 98 F (36.7 C)  TempSrc: Oral  SpO2: 100%  Weight: 215 lb 11.2 oz (97.8 kg)  Height: 5\' 1"  (1.549 m)    General: Well-appearing young female in no acute distress Cardiac: regular rate and rhythm, nl S1/S2, no murmurs, rubs or gallops Pulm: CTAB, no wheezes or crackles, no increased work of breathing on room air   Assessment & Plan:   See Encounters Tab for problem based charting.  Patient discussed with Dr. Philipp Ovens'

## 2020-01-29 DIAGNOSIS — F1291 Cannabis use, unspecified, in remission: Secondary | ICD-10-CM | POA: Insufficient documentation

## 2020-02-04 ENCOUNTER — Encounter: Payer: Self-pay | Admitting: *Deleted

## 2020-02-27 ENCOUNTER — Other Ambulatory Visit: Payer: Self-pay

## 2020-02-27 ENCOUNTER — Encounter (HOSPITAL_COMMUNITY): Payer: Self-pay

## 2020-02-27 ENCOUNTER — Ambulatory Visit (HOSPITAL_COMMUNITY)
Admission: EM | Admit: 2020-02-27 | Discharge: 2020-02-27 | Disposition: A | Discharge status: Home / Self Care | Payer: Medicaid Other | Attending: Family Medicine | Admitting: Family Medicine

## 2020-02-27 DIAGNOSIS — O99332 Smoking (tobacco) complicating pregnancy, second trimester: Secondary | ICD-10-CM | POA: Diagnosis not present

## 2020-02-27 DIAGNOSIS — O26892 Other specified pregnancy related conditions, second trimester: Secondary | ICD-10-CM | POA: Insufficient documentation

## 2020-02-27 DIAGNOSIS — R21 Rash and other nonspecific skin eruption: Secondary | ICD-10-CM | POA: Insufficient documentation

## 2020-02-27 DIAGNOSIS — Z3A15 15 weeks gestation of pregnancy: Secondary | ICD-10-CM | POA: Diagnosis not present

## 2020-02-27 DIAGNOSIS — Z8349 Family history of other endocrine, nutritional and metabolic diseases: Secondary | ICD-10-CM | POA: Diagnosis not present

## 2020-02-27 DIAGNOSIS — O99282 Endocrine, nutritional and metabolic diseases complicating pregnancy, second trimester: Secondary | ICD-10-CM | POA: Insufficient documentation

## 2020-02-27 DIAGNOSIS — R059 Cough, unspecified: Secondary | ICD-10-CM

## 2020-02-27 DIAGNOSIS — R05 Cough: Secondary | ICD-10-CM | POA: Diagnosis not present

## 2020-02-27 DIAGNOSIS — Z20822 Contact with and (suspected) exposure to covid-19: Secondary | ICD-10-CM | POA: Insufficient documentation

## 2020-02-27 DIAGNOSIS — Z79899 Other long term (current) drug therapy: Secondary | ICD-10-CM | POA: Insufficient documentation

## 2020-02-27 DIAGNOSIS — O219 Vomiting of pregnancy, unspecified: Secondary | ICD-10-CM | POA: Diagnosis not present

## 2020-02-27 DIAGNOSIS — E785 Hyperlipidemia, unspecified: Secondary | ICD-10-CM | POA: Diagnosis not present

## 2020-02-27 DIAGNOSIS — R112 Nausea with vomiting, unspecified: Secondary | ICD-10-CM

## 2020-02-27 DIAGNOSIS — F17210 Nicotine dependence, cigarettes, uncomplicated: Secondary | ICD-10-CM | POA: Insufficient documentation

## 2020-02-27 LAB — CBC WITH DIFFERENTIAL/PLATELET
Abs Immature Granulocytes: 0.1 10*3/uL — ABNORMAL HIGH (ref 0.00–0.07)
Basophils Absolute: 0 10*3/uL (ref 0.0–0.1)
Basophils Relative: 0 %
Eosinophils Absolute: 0 10*3/uL (ref 0.0–0.5)
Eosinophils Relative: 0 %
HCT: 37.2 % (ref 36.0–46.0)
Hemoglobin: 12.8 g/dL (ref 12.0–15.0)
Immature Granulocytes: 1 %
Lymphocytes Relative: 11 %
Lymphs Abs: 1.8 10*3/uL (ref 0.7–4.0)
MCH: 31.2 pg (ref 26.0–34.0)
MCHC: 34.4 g/dL (ref 30.0–36.0)
MCV: 90.7 fL (ref 80.0–100.0)
Monocytes Absolute: 0.5 10*3/uL (ref 0.1–1.0)
Monocytes Relative: 3 %
Neutro Abs: 14.4 10*3/uL — ABNORMAL HIGH (ref 1.7–7.7)
Neutrophils Relative %: 85 %
Platelets: 308 10*3/uL (ref 150–400)
RBC: 4.1 MIL/uL (ref 3.87–5.11)
RDW: 11.9 % (ref 11.5–15.5)
WBC: 16.8 10*3/uL — ABNORMAL HIGH (ref 4.0–10.5)
nRBC: 0 % (ref 0.0–0.2)

## 2020-02-27 LAB — COMPREHENSIVE METABOLIC PANEL
ALT: 20 U/L (ref 0–44)
AST: 26 U/L (ref 15–41)
Albumin: 3.4 g/dL — ABNORMAL LOW (ref 3.5–5.0)
Alkaline Phosphatase: 63 U/L (ref 38–126)
Anion gap: 12 (ref 5–15)
BUN: <5 mg/dL — ABNORMAL LOW (ref 6–20)
CO2: 19 mmol/L — ABNORMAL LOW (ref 22–32)
Calcium: 9.2 mg/dL (ref 8.9–10.3)
Chloride: 105 mmol/L (ref 98–111)
Creatinine, Ser: 0.52 mg/dL (ref 0.44–1.00)
GFR calc Af Amer: >60 mL/min (ref >60)
GFR calc non Af Amer: >60 mL/min (ref >60)
Glucose, Bld: 86 mg/dL (ref 70–99)
Potassium: 4 mmol/L (ref 3.5–5.1)
Sodium: 136 mmol/L (ref 135–145)
Total Bilirubin: 0.7 mg/dL (ref 0.3–1.2)
Total Protein: 6.7 g/dL (ref 6.5–8.1)

## 2020-02-27 LAB — AMYLASE: Amylase: 24 U/L — ABNORMAL LOW (ref 28–100)

## 2020-02-27 LAB — LIPASE, BLOOD: Lipase: 19 U/L (ref 11–51)

## 2020-02-27 MED ORDER — FAMOTIDINE 20 MG PO TABS
ORAL_TABLET | ORAL | Status: AC
  Filled 2020-02-27: qty 1

## 2020-02-27 MED ORDER — ONDANSETRON HCL 4 MG/2ML IJ SOLN
4.0000 mg | Freq: Once | INTRAMUSCULAR | Status: AC
  Administered 2020-02-27: 4 mg via INTRAMUSCULAR

## 2020-02-27 MED ORDER — ALBUTEROL SULFATE HFA 108 (90 BASE) MCG/ACT IN AERS
2.0000 | INHALATION_SPRAY | Freq: Once | RESPIRATORY_TRACT | Status: AC
  Administered 2020-02-27: 2 via RESPIRATORY_TRACT

## 2020-02-27 MED ORDER — GUAIFENESIN 100 MG/5ML PO LIQD: 100.0000 mg | ORAL | 0 refills | Status: DC | PRN

## 2020-02-27 MED ORDER — ALBUTEROL SULFATE HFA 108 (90 BASE) MCG/ACT IN AERS
INHALATION_SPRAY | RESPIRATORY_TRACT | Status: AC
  Filled 2020-02-27: qty 6.7

## 2020-02-27 MED ORDER — LORATADINE 10 MG PO TABS
10.0000 mg | ORAL_TABLET | Freq: Every day | ORAL | 1 refills | Status: DC
Start: 2020-02-27 — End: 2021-09-23

## 2020-02-27 MED ORDER — ONDANSETRON HCL 4 MG/2ML IJ SOLN
INTRAMUSCULAR | Status: AC
  Filled 2020-02-27: qty 2

## 2020-02-27 MED ORDER — FAMOTIDINE 20 MG PO TABS
10.0000 mg | ORAL_TABLET | Freq: Once | ORAL | Status: AC
  Administered 2020-02-27: 10 mg via ORAL

## 2020-02-27 NOTE — Discharge Instructions (Addendum)
I believe the cough is most likely something viral.  This is most likely cause of your nausea and vomiting If we can control the cough we hopefully can help stop the nausea and vomiting so you are able to drink fluids and stay hydrated. Robitussin for cough Your Covid test is pending and we will call with any positive results.   Checking lab work I recommend you follow-up with your OB/GYN if this problem continues If you are unable to hold fluids down over the next 24 to 48 hours you need to go to the hospital for IV hydration

## 2020-02-27 NOTE — ED Triage Notes (Addendum)
Pt is here with congestion and a cough that started last night. Pt states she has a rash that developed after taking Tylenol Severe Cold & Flu to relieve discomfort.

## 2020-02-28 ENCOUNTER — Telehealth (HOSPITAL_COMMUNITY): Payer: Self-pay | Admitting: Family Medicine

## 2020-02-28 LAB — SARS CORONAVIRUS 2 (TAT 6-24 HRS): SARS Coronavirus 2: NEGATIVE

## 2020-02-28 MED ORDER — AMOXICILLIN 500 MG PO CAPS
500.0000 mg | ORAL_CAPSULE | Freq: Three times a day (TID) | ORAL | 0 refills | Status: AC
Start: 2020-02-28 — End: 2020-03-04

## 2020-02-28 NOTE — Telephone Encounter (Signed)
Went over lab results with patient no specifically white blood cell count We will go ahead and cover for upper respiratory infection with antibiotics

## 2020-02-28 NOTE — ED Provider Notes (Signed)
[place]    CSN: [id number] Arrival date & time: 02/27/20  1478      History   Chief Complaint Chief Complaint  Patient presents with  . Cough  . Rash    HPI Meghan Riggs is a 24 y.o. female.   Patient is a 24 year old female presents today with cough, congestion, nausea, vomiting that started yesterday evening.  Symptoms have been constant.  She is having posttussive vomiting.  She took Tylenol Cold and flu last night which she feels made her symptoms worse.  She has been itching all over.  Has had family members sick with similar symptoms.  No chest pain or shortness of breath.  No fevers, chills but mild body aches.  She is approximately [redacted] weeks pregnant.  Took Zofran for nausea, vomiting and vomited up.  Has not been able to hold down any food or fluids since last night.  Patient also having some generalized upper abdominal discomfort that she describes as soreness.  Has history of gallbladder issues.   ROS per HPI      Past Medical History:  Diagnosis Date  . Asthma    Dr. Donneta Romberg  . Club foot 1996   Congenital  . Hyperlipidemia     Patient Active Problem List   Diagnosis Date Noted  . Amenorrhea 01/02/2020  . Back pain 11/19/2018  . Asthma 09/10/2018  . Hyperlipidemia 09/10/2018  . Right flank pain 09/10/2018  . Urinary tract infection with hematuria 09/10/2018  . 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 APPENDECTOMY    . CLUB FOOT RELEASE  7237   @ 51 days old  . KNEE SURGERY    . LAPAROSCOPIC APPENDECTOMY N/A 06/04/2017   Procedure: APPENDECTOMY LAPAROSCOPIC;  Surgeon: Erroll Luna, MD;  Location: [facility];  Service: General;  Laterality: N/A;    OB History   No obstetric history on file.      Home Medications    Prior to Admission medications   Medication Sig Start Date End Date Taking? Authorizing Provider  Accu-Chek Softclix Lancets lancets Use 4 lancets daily.  02/25/20  Yes [provider]  Blood Glucose Monitoring Suppl (ACCU-CHEK GUIDE) w/Device KIT Use 4 times daily to monitor blood sugars 02/25/20  Yes [provider]  glucose blood (ACCU-CHEK GUIDE) test strip Use to monitor blood glucose 4 time(s) daily 02/25/20  Yes [provider]  ondansetron (ZOFRAN-ODT) 4 MG disintegrating tablet Take by mouth. 02/13/20  Yes [provider]  pantoprazole (PROTONIX) 40 MG tablet Take by mouth. 02/13/20  Yes [provider]  albuterol (PROVENTIL HFA;VENTOLIN HFA) 108 (90 Base) MCG/ACT inhaler Inhale 1-2 puffs into the lungs every 6 (six) hours as needed for wheezing or shortness of breath. 09/10/18   Neva Seat, MD  AMITRIPTYLINE HCL PO amitriptyline    [provider]  amoxicillin (AMOXIL) 500 MG capsule Take 1 capsule (500 mg total) by mouth 3 (three) times daily for 5 days. 02/28/20 03/04/20  Orvan July, NP  etonogestrel-ethinyl estradiol (NUVARING) 0.12-0.015 MG/24HR vaginal ring NuvaRing 0.12 mg-0.015 mg/24 hr vaginal  Insert 1 vaginal ring(s) for continuous use by vaginal route as directed.    [provider]  guaiFENesin (ROBITUSSIN) 100 MG/5ML liquid Take 5-10 mLs (100-200 mg total) by mouth every 4 (four) hours as needed for cough. 02/27/20   Loura Halt A, NP  loratadine (CLARITIN) 10 MG tablet Take 1 tablet (10 mg total) by mouth daily.  02/27/20   Loura Halt A, NP  Loratadine 10 MG CAPS loratadine 10 mg capsule    [provider]  metroNIDAZOLE (FLAGYL) 500 MG tablet metronidazole 500 mg tablet  TAKE 1 TABLET BY MOUTH TWICE A DAY FOR 7 DAYS    [provider]  nitrofurantoin (MACRODANTIN) 100 MG capsule nitrofurantoin macrocrystal 100 mg capsule  TK ONE C PO  BID    [provider]  omeprazole (PRILOSEC) 20 MG capsule omeprazole 20 mg capsule,delayed release  TK 1 C PO QD FOR REFLUX    [provider]  ondansetron (ZOFRAN) 8 MG tablet ondansetron HCl 8  mg tablet  TAKE 1 TABLET BY MOUTH EVERY 8 HOURS AS NEEDED NAUSEA AND VOMITING    [provider]  oxyCODONE-acetaminophen (PERCOCET/ROXICET) 5-325 MG tablet oxycodone-acetaminophen 5 mg-325 mg tablet  TAKE 1 TABLET BY MOUTH EVERY 4 HOURS AS NEEDED FOR PAIN    [provider]  PRENATAL VIT-FE FUM-FA-FAT AC PO Take by mouth.    [provider]  promethazine (PHENERGAN) 25 MG tablet Take by mouth.    [provider]  pyridoxine (B-6) 100 MG tablet     [provider]  sulfamethoxazole-trimethoprim (BACTRIM DS) 800-160 MG tablet sulfamethoxazole 800 mg-trimethoprim 160 mg tablet  TAKE 1 TABLET BY MOUTH TWICE A DAY    [provider]    Family History Family History  Problem Relation Age of Onset  . Diabetes Father   . Brain cancer Father   . Diabetes Paternal Uncle   . Diabetes Paternal Grandmother   . COPD Mother   . Asthma Mother   . Cervical cancer Mother   . Interstitial cystitis Mother   . Graves' disease Mother     Social History Social History   Tobacco Use  . Smoking status: Light Tobacco Smoker    Types: Cigarettes  . Smokeless tobacco: Never Used  . Tobacco comment: States she quit last week  Substance Use Topics  . Alcohol use: Yes  . Drug use: No     Allergies   Patient has no known allergies.   Review of Systems Review of Systems   Physical Exam Triage Vital Signs ED Triage Vitals  Enc Vitals Group     BP 02/27/20 0942 122/60     Pulse Rate 02/27/20 0942 95     Resp 02/27/20 0942 18     Temp 02/27/20 0942 98.6 F (37 C)     Temp Source 02/27/20 0942 Oral     SpO2 02/27/20 0942 100 %     Weight 02/27/20 0947 208 lb 3.2 oz (94.4 kg)     Height --      Head Circumference --      Peak Flow --      Pain Score 02/27/20 0937 0     Pain Loc --      Pain Edu? --      Excl. in [place]? --    No data found.  Updated Vital Signs BP 122/60 (BP Location: Left Arm)   Pulse 95   Temp 98.6 F (37 C)  (Oral)   Resp 18   Wt 208 lb 3.2 oz (94.4 kg)   SpO2 100%   BMI 39.34 kg/m   Visual Acuity Right Eye Distance:   Left Eye Distance:   Bilateral Distance:    Right Eye Near:   Left Eye Near:    Bilateral Near:     Physical Exam Constitutional:  General: She is not in acute distress.    Appearance: She is ill-appearing. She is not toxic-appearing or diaphoretic.  HENT:     Right Ear: Tympanic membrane and ear canal normal.     Left Ear: Tympanic membrane and ear canal normal.     Nose: Nose normal.     Mouth/Throat:     Pharynx: Oropharynx is clear.  Eyes:     Conjunctiva/sclera: Conjunctivae normal.  Cardiovascular:     Rate and Rhythm: Normal rate and regular rhythm.     Heart sounds: Normal heart sounds.  Pulmonary:     Effort: Pulmonary effort is normal.     Breath sounds: Normal breath sounds.     Comments: Constant cough during exam with gagging.   Musculoskeletal:        General: Normal range of motion.     Cervical back: Normal range of motion.  Skin:    General: Skin is warm and dry.     Findings: No rash.  Neurological:     Mental Status: She is alert.  Psychiatric:        Mood and Affect: Mood normal.      UC Treatments / Results  Labs (all labs ordered are listed, but only abnormal results are displayed) Labs Reviewed  CBC WITH DIFFERENTIAL/PLATELET - Abnormal; Notable for the following components:      Result Value   WBC 16.8 (*)    Neutro Abs 14.4 (*)    Abs Immature Granulocytes 0.10 (*)    All other components within normal limits  COMPREHENSIVE METABOLIC PANEL - Abnormal; Notable for the following components:   CO2 19 (*)    BUN <5 (*)    Albumin 3.4 (*)    All other components within normal limits  AMYLASE - Abnormal; Notable for the following components:   Amylase 24 (*)    All other components within normal limits  SARS CORONAVIRUS 2 (TAT 6-24 HRS)  LIPASE, BLOOD    EKG   Radiology No results  found.  Procedures Procedures (including critical care time)  Medications Ordered in UC Medications  albuterol (VENTOLIN HFA) 108 (90 Base) MCG/ACT inhaler 2 puff (2 puffs Inhalation Given 02/27/20 1056)  ondansetron (ZOFRAN) injection 4 mg (4 mg Intramuscular Given 02/27/20 1056)  famotidine (PEPCID) tablet 10 mg (10 mg Oral Given 02/27/20 1057)    Initial Impression / Assessment and Plan / UC Course  I have reviewed the triage vital signs and the nursing notes.  Pertinent labs & imaging results that were available during my care of the patient were reviewed by me and considered in my medical decision making (see chart for details).     Cough with posttussive vomiting Most likely something viral.  Her lungs are clear on exam.  She has had sick family members with similar symptoms. Albuterol inhaler for cough, wheezing or shortness of breath. Rash may be allergic or related to the virus. Zofran IM given here for nausea, vomiting. Pepcid given here for allergic reaction  She will continue Claritin daily Covid swab pending Prescribe Robitussin for cough and recommended taking  Recommended drink fluids to stay hydrated. Blood work obtained based on the upper abdominal discomfort and history of gallbladder issues.    Blood work mostly unremarkable besides the elevated white blood count of 16.8 and high neutrophil count Call patient to relay results this morning and patient states she is feeling worse with more congestion in her chest and coughing. Based on lab results and  worsening symptoms we will go ahead and cover with antibiotics to cover for infection Amoxicillin sent to the pharmacy.  She can continue the Zofran as needed for nausea, vomiting, Robitussin for cough, Claritin daily and albuterol inhaler as needed. Recommend if symptoms worsen throughout the weekend she will need to go to the ER Otherwise she can follow-up with her OB/GYN   final Clinical Impressions(s) / UC  Diagnoses   Final diagnoses:  Cough  Non-intractable vomiting with nausea, unspecified vomiting type     Discharge Instructions     I believe the cough is most likely something viral.  This is most likely cause of your nausea and vomiting If we can control the cough we hopefully can help stop the nausea and vomiting so you are able to drink fluids and stay hydrated. Robitussin for cough Your Covid test is pending and we will call with any positive results.   Checking lab work I recommend you follow-up with your OB/GYN if this problem continues If you are unable to hold fluids down over the next 24 to 48 hours you need to go to the hospital for IV hydration    ED Prescriptions    Medication Sig Dispense Auth. Provider   guaiFENesin (ROBITUSSIN) 100 MG/5ML liquid Take 5-10 mLs (100-200 mg total) by mouth every 4 (four) hours as needed for cough. 60 mL Loveta Dellis A, NP   loratadine (CLARITIN) 10 MG tablet Take 1 tablet (10 mg total) by mouth daily. 30 tablet Loura Halt A, NP     PDMP not reviewed this encounter.   Loura Halt A, NP 02/28/20 1036

## 2020-05-23 IMAGING — CR DG LUMBAR SPINE COMPLETE 4+V
5 series · 5 of 5 positions shown · non-contrast
Comparison: None.

CLINICAL DATA: Lumbago

EXAM:
LUMBAR SPINE - COMPLETE 4+ VIEW

[l-spine ap]
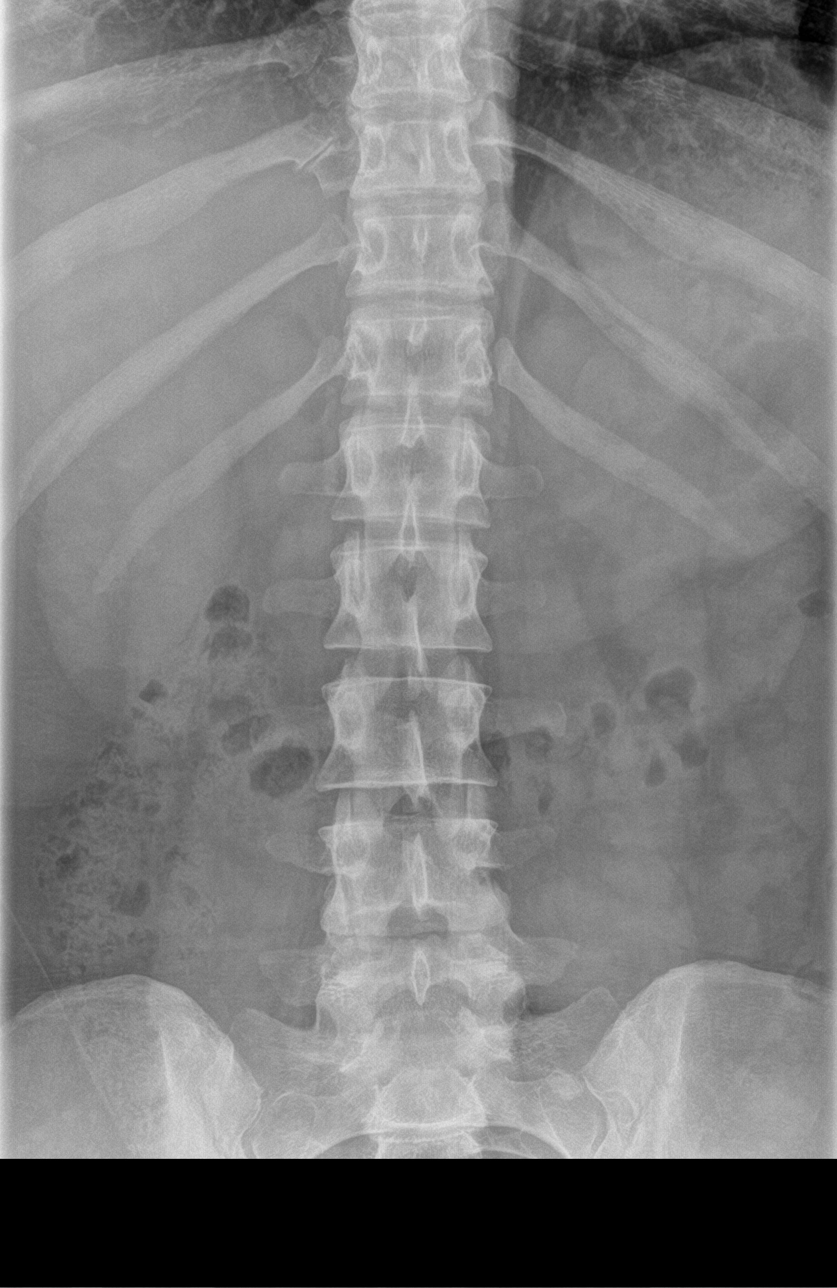

[l-spine lat]
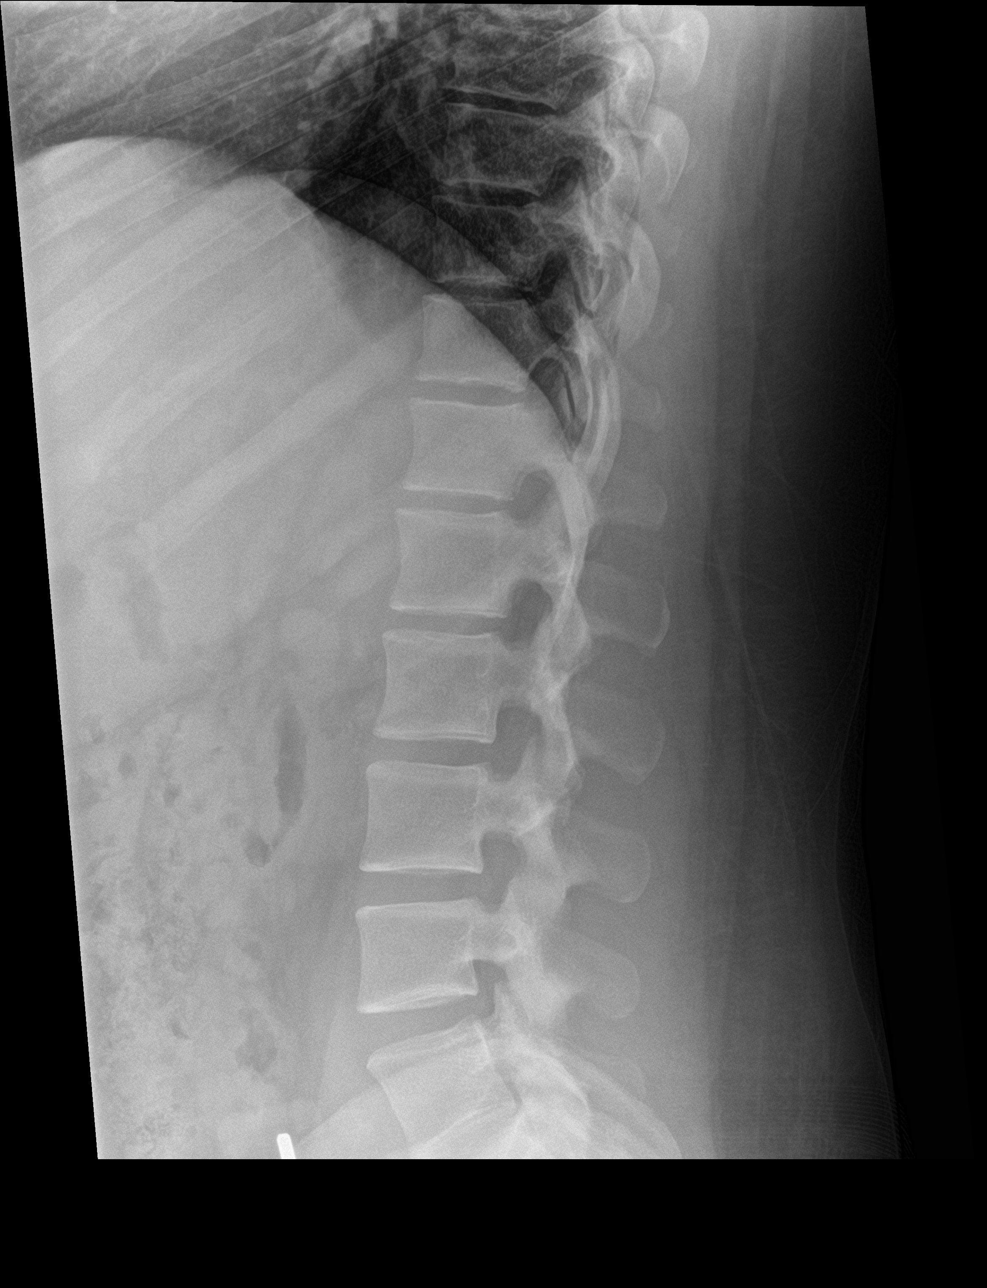

[l-spine spot]
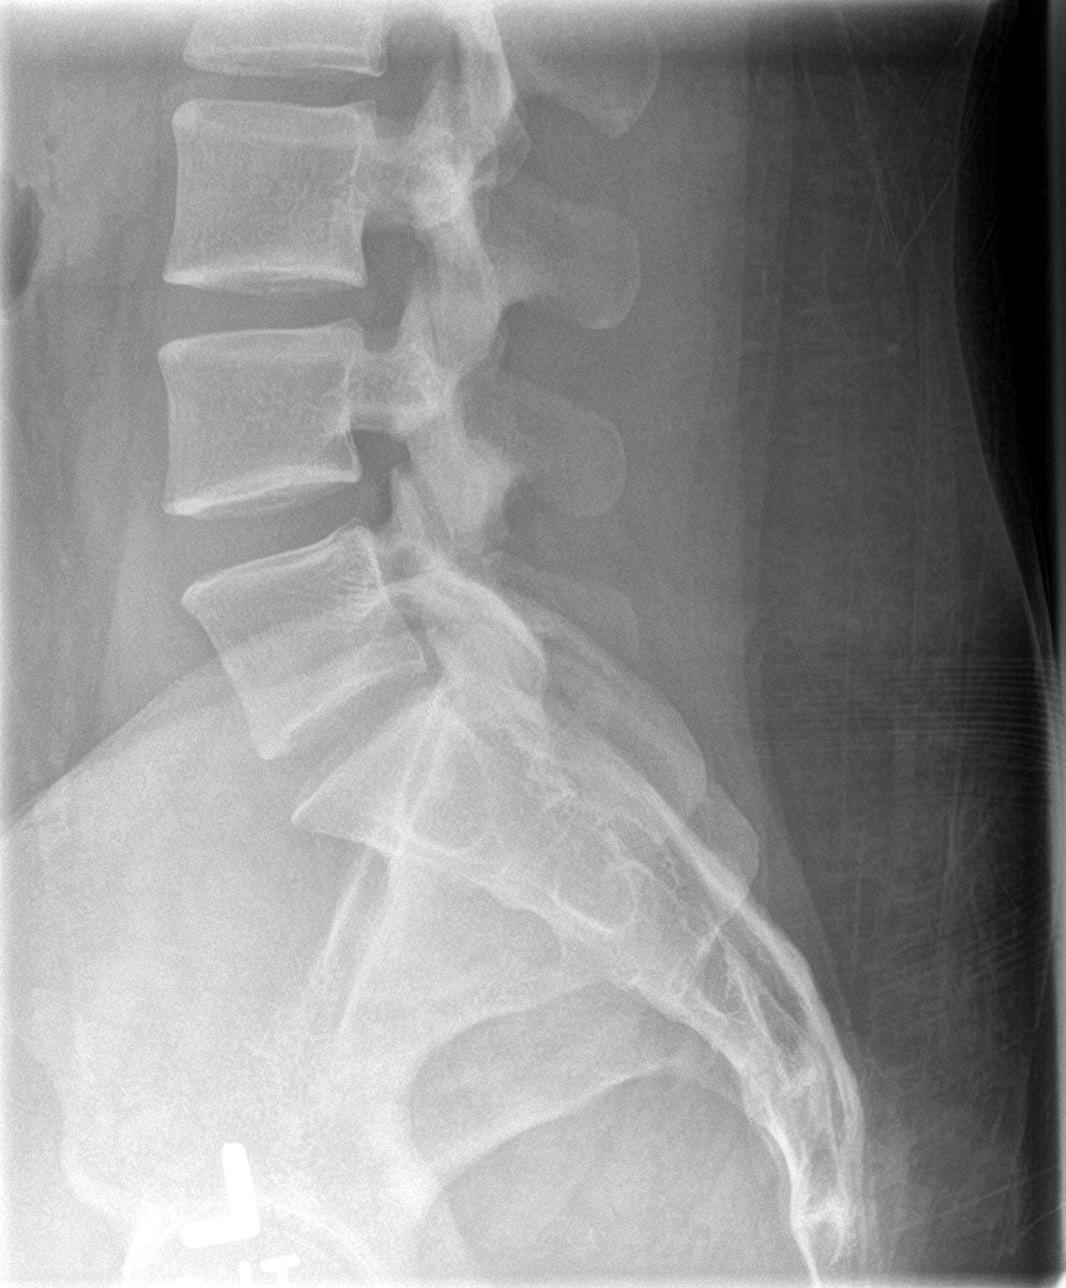

[l-spine obl (1 of 2)]
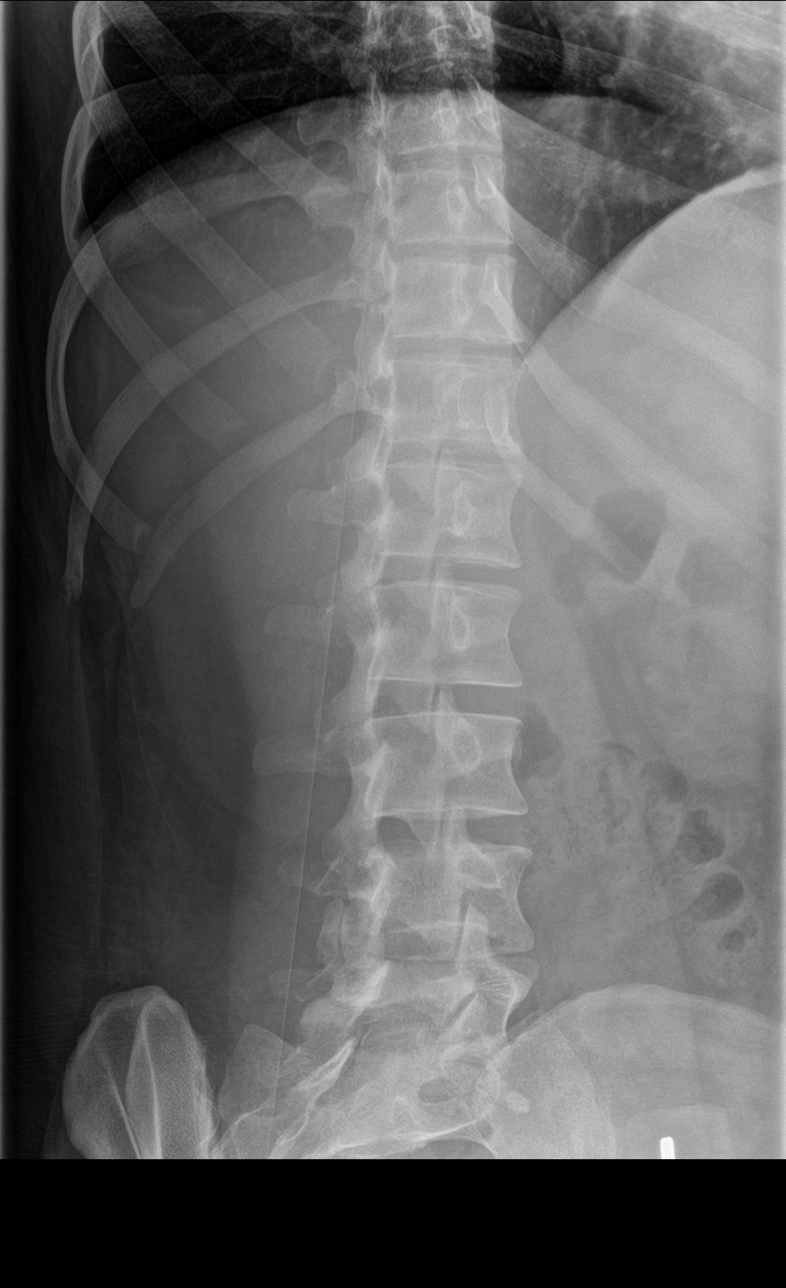

[l-spine obl (2 of 2)]
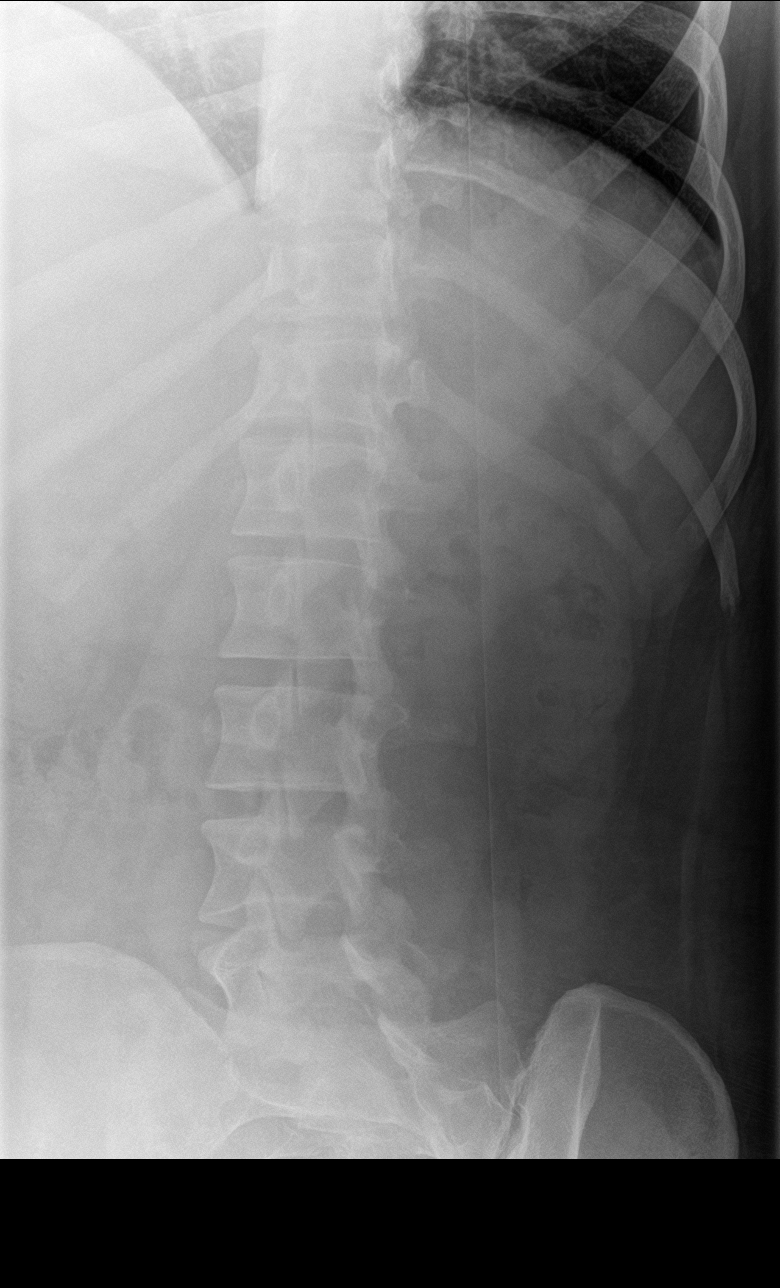

[5 of 5 positions shown; findings below may reference images not displayed]

FINDINGS: Frontal, lateral, spot lumbosacral lateral, and bilateral oblique
views were obtained. The there are 5 nonrib-bearing lumbar type
vertebral bodies. There is no fracture or spondylolisthesis. Disc
spaces appear unremarkable. There is slight facet osteoarthritic
change on the right at L5-S1. Other facets appear normal.
IMPRESSION: Slight facet osteoarthritic change on the right at L5-S1. No other
appreciable arthropathy. No fracture or spondylolisthesis.

## 2020-06-01 ENCOUNTER — Inpatient Hospital Stay (HOSPITAL_BASED_OUTPATIENT_CLINIC_OR_DEPARTMENT_OTHER): Payer: Medicaid Other

## 2020-06-01 ENCOUNTER — Encounter (HOSPITAL_COMMUNITY): Payer: Self-pay | Admitting: Obstetrics and Gynecology

## 2020-06-01 ENCOUNTER — Ambulatory Visit (HOSPITAL_COMMUNITY)
Admission: EM | Admit: 2020-06-01 | Discharge: 2020-06-01 | Payer: Medicaid Other | Attending: Physician Assistant | Admitting: Physician Assistant

## 2020-06-01 ENCOUNTER — Inpatient Hospital Stay (HOSPITAL_COMMUNITY)
Admission: AD | Admit: 2020-06-01 | Discharge: 2020-06-01 | Disposition: A | Discharge status: Home / Self Care | Payer: Medicaid Other | Attending: Obstetrics and Gynecology | Admitting: Obstetrics and Gynecology

## 2020-06-01 ENCOUNTER — Encounter (HOSPITAL_COMMUNITY): Payer: Self-pay

## 2020-06-01 ENCOUNTER — Other Ambulatory Visit: Payer: Self-pay

## 2020-06-01 DIAGNOSIS — D649 Anemia, unspecified: Secondary | ICD-10-CM | POA: Diagnosis not present

## 2020-06-01 DIAGNOSIS — O26893 Other specified pregnancy related conditions, third trimester: Secondary | ICD-10-CM | POA: Diagnosis not present

## 2020-06-01 DIAGNOSIS — Z794 Long term (current) use of insulin: Secondary | ICD-10-CM | POA: Diagnosis not present

## 2020-06-01 DIAGNOSIS — O368393 Maternal care for abnormalities of the fetal heart rate or rhythm, unspecified trimester, fetus 3: Secondary | ICD-10-CM

## 2020-06-01 DIAGNOSIS — Z833 Family history of diabetes mellitus: Secondary | ICD-10-CM | POA: Diagnosis not present

## 2020-06-01 DIAGNOSIS — O99513 Diseases of the respiratory system complicating pregnancy, third trimester: Secondary | ICD-10-CM | POA: Diagnosis not present

## 2020-06-01 DIAGNOSIS — Z3A28 28 weeks gestation of pregnancy: Secondary | ICD-10-CM

## 2020-06-01 DIAGNOSIS — O98513 Other viral diseases complicating pregnancy, third trimester: Secondary | ICD-10-CM | POA: Insufficient documentation

## 2020-06-01 DIAGNOSIS — Z3A00 Weeks of gestation of pregnancy not specified: Secondary | ICD-10-CM | POA: Diagnosis not present

## 2020-06-01 DIAGNOSIS — Z79899 Other long term (current) drug therapy: Secondary | ICD-10-CM | POA: Diagnosis not present

## 2020-06-01 DIAGNOSIS — Z3689 Encounter for other specified antenatal screening: Secondary | ICD-10-CM

## 2020-06-01 DIAGNOSIS — O99013 Anemia complicating pregnancy, third trimester: Secondary | ICD-10-CM | POA: Diagnosis not present

## 2020-06-01 DIAGNOSIS — O368330 Maternal care for abnormalities of the fetal heart rate or rhythm, third trimester, not applicable or unspecified: Secondary | ICD-10-CM | POA: Diagnosis not present

## 2020-06-01 DIAGNOSIS — Z20822 Contact with and (suspected) exposure to covid-19: Secondary | ICD-10-CM | POA: Insufficient documentation

## 2020-06-01 DIAGNOSIS — J069 Acute upper respiratory infection, unspecified: Secondary | ICD-10-CM | POA: Diagnosis not present

## 2020-06-01 DIAGNOSIS — F17210 Nicotine dependence, cigarettes, uncomplicated: Secondary | ICD-10-CM | POA: Insufficient documentation

## 2020-06-01 DIAGNOSIS — O368390 Maternal care for abnormalities of the fetal heart rate or rhythm, unspecified trimester, not applicable or unspecified: Secondary | ICD-10-CM

## 2020-06-01 DIAGNOSIS — E785 Hyperlipidemia, unspecified: Secondary | ICD-10-CM | POA: Insufficient documentation

## 2020-06-01 DIAGNOSIS — Z0371 Encounter for suspected problem with amniotic cavity and membrane ruled out: Secondary | ICD-10-CM | POA: Diagnosis not present

## 2020-06-01 DIAGNOSIS — O24313 Unspecified pre-existing diabetes mellitus in pregnancy, third trimester: Secondary | ICD-10-CM | POA: Diagnosis not present

## 2020-06-01 DIAGNOSIS — R7303 Prediabetes: Secondary | ICD-10-CM | POA: Insufficient documentation

## 2020-06-01 DIAGNOSIS — O99333 Smoking (tobacco) complicating pregnancy, third trimester: Secondary | ICD-10-CM | POA: Diagnosis not present

## 2020-06-01 DIAGNOSIS — Z3490 Encounter for supervision of normal pregnancy, unspecified, unspecified trimester: Secondary | ICD-10-CM

## 2020-06-01 DIAGNOSIS — R1030 Lower abdominal pain, unspecified: Secondary | ICD-10-CM | POA: Diagnosis not present

## 2020-06-01 DIAGNOSIS — F17200 Nicotine dependence, unspecified, uncomplicated: Secondary | ICD-10-CM

## 2020-06-01 DIAGNOSIS — Z8349 Family history of other endocrine, nutritional and metabolic diseases: Secondary | ICD-10-CM | POA: Insufficient documentation

## 2020-06-01 DIAGNOSIS — R0989 Other specified symptoms and signs involving the circulatory and respiratory systems: Secondary | ICD-10-CM

## 2020-06-01 DIAGNOSIS — Z9049 Acquired absence of other specified parts of digestive tract: Secondary | ICD-10-CM | POA: Insufficient documentation

## 2020-06-01 DIAGNOSIS — O99891 Other specified diseases and conditions complicating pregnancy: Secondary | ICD-10-CM

## 2020-06-01 DIAGNOSIS — R0981 Nasal congestion: Secondary | ICD-10-CM | POA: Diagnosis present

## 2020-06-01 DIAGNOSIS — J45909 Unspecified asthma, uncomplicated: Secondary | ICD-10-CM

## 2020-06-01 DIAGNOSIS — O99283 Endocrine, nutritional and metabolic diseases complicating pregnancy, third trimester: Secondary | ICD-10-CM | POA: Insufficient documentation

## 2020-06-01 DIAGNOSIS — Z87798 Personal history of other (corrected) congenital malformations: Secondary | ICD-10-CM

## 2020-06-01 LAB — WET PREP, GENITAL
Sperm: NONE SEEN
Trich, Wet Prep: NONE SEEN
Yeast Wet Prep HPF POC: NONE SEEN

## 2020-06-01 LAB — SARS CORONAVIRUS 2 (TAT 6-24 HRS): SARS Coronavirus 2: NEGATIVE

## 2020-06-01 LAB — URINALYSIS, ROUTINE W REFLEX MICROSCOPIC
Bilirubin Urine: NEGATIVE
Glucose, UA: NEGATIVE mg/dL
Hgb urine dipstick: NEGATIVE
Ketones, ur: NEGATIVE mg/dL
Leukocytes,Ua: NEGATIVE
Nitrite: NEGATIVE
Protein, ur: NEGATIVE mg/dL
Specific Gravity, Urine: 1.017 (ref 1.005–1.030)
pH: 6 (ref 5.0–8.0)

## 2020-06-01 LAB — GLUCOSE, CAPILLARY: Glucose-Capillary: 73 mg/dL (ref 70–99)

## 2020-06-01 LAB — FETAL FIBRONECTIN: Fetal Fibronectin: NEGATIVE

## 2020-06-01 LAB — AMNISURE RUPTURE OF MEMBRANE (ROM) NOT AT ARMC: Amnisure ROM: NEGATIVE

## 2020-06-01 NOTE — MAU Note (Signed)
Wed, started having congestion, sneezing, really puffy eyes, thought it was just allergies.  Started taking meds for the mucous. Started coughing on Fri, wasn't sure if it was just from all the drainage or what. ? Urine leaking or ROM, occurred when coughing/ driving. Only occurs when coughs. Went to UC, explained it to them- so they were concerned that it was her water, so sent her for further eval.  Now is tender in lower abd.  Coughing so hard at times she sees stars, has almost fallen out.  Denies fever

## 2020-06-01 NOTE — ED Provider Notes (Signed)
[place] [facility]    CSN: [id number] Arrival date & time: 06/01/20  6712      History   Chief Complaint Chief Complaint  Patient presents with  . Nasal Congestion    HPI Meghan Riggs is a 24 y.o. female.   Patient who is self-reported at 7 months pregnant presents for nasal congestion, cough and lower belly pain.  She reports symptoms been present since last week about 5 days ago.  She reports lots of nasal congestion.  She reports frequent coughing.  She endorses some wheezing and has used her inhaler with some relief.  She also reports coughing is caused lots of lower abdominal pain.  She reports yesterday a coughing fit which lasted 15 minutes which was followed by her having an uncontrolled count of fluid in her hands.  She reports this did not smell like urine.  She is unsure whether this was urine or came from her vagina.  Since then she has reported some abdominal discomfort.  No vaginal bleeding.  She reports she does feel baby kicking.  She denies fever at home.  Cough has been nonproductive.  No vomiting or diarrhea.  No known sick contacts.  She has been trying over-the-counter medications to include mucus reducers and Claritin.  She is unsure of what is safe during pregnancy.     Past Medical History:  Diagnosis Date  . Asthma    Dr. Donneta Romberg  . Club foot 1996   Congenital  . Hyperlipidemia     Patient Active Problem List   Diagnosis Date Noted  . Amenorrhea 01/02/2020  . Back pain 11/19/2018  . Asthma 09/10/2018  . Hyperlipidemia 09/10/2018  . Right flank pain 09/10/2018  . Urinary tract infection with hematuria 09/10/2018  . 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 APPENDECTOMY    . CLUB FOOT RELEASE  5388   @ 71 days old  . KNEE SURGERY    . LAPAROSCOPIC APPENDECTOMY N/A 06/04/2017   Procedure: APPENDECTOMY LAPAROSCOPIC;  Surgeon: Erroll Luna, MD;  Location: [facility];  Service: General;   Laterality: N/A;    OB History    Gravida  1   Para      Term      Preterm      AB      Living        SAB      TAB      Ectopic      Multiple      Live Births               Home Medications    Prior to Admission medications   Medication Sig Start Date End Date Taking? Authorizing Provider  famotidine (PEPCID) 10 MG tablet Take 10 mg by mouth 2 (two) times daily.   Yes [provider]  ferrous sulfate 324 MG TBEC Take 324 mg by mouth.   Yes [provider]  insulin regular (NOVOLIN R) 100 units/mL injection Inject into the skin 3 (three) times daily before meals.   Yes [provider]  Accu-Chek Softclix Lancets lancets Use 4 lancets daily. 02/25/20   [provider]  albuterol (PROVENTIL HFA;VENTOLIN HFA) 108 (90 Base) MCG/ACT inhaler Inhale 1-2 puffs into the lungs every 6 (six) hours as needed for wheezing or shortness of breath. 09/10/18   Neva Seat, MD  Blood Glucose Monitoring Suppl (ACCU-CHEK GUIDE) w/Device KIT Use 4 times daily to monitor blood  sugars 02/25/20   [provider]  glucose blood (ACCU-CHEK GUIDE) test strip Use to monitor blood glucose 4 time(s) daily 02/25/20   [provider]  guaiFENesin (ROBITUSSIN) 100 MG/5ML liquid Take 5-10 mLs (100-200 mg total) by mouth every 4 (four) hours as needed for cough. 02/27/20   Loura Halt A, NP  loratadine (CLARITIN) 10 MG tablet Take 1 tablet (10 mg total) by mouth daily. 02/27/20   Loura Halt A, NP  Loratadine 10 MG CAPS loratadine 10 mg capsule    [provider]  ondansetron (ZOFRAN) 8 MG tablet ondansetron HCl 8 mg tablet  TAKE 1 TABLET BY MOUTH EVERY 8 HOURS AS NEEDED NAUSEA AND VOMITING    [provider]  ondansetron (ZOFRAN-ODT) 4 MG disintegrating tablet Take by mouth. 02/13/20   [provider]  pantoprazole (PROTONIX) 40 MG tablet Take by mouth. 02/13/20   [provider]  PRENATAL VIT-FE FUM-FA-FAT AC PO  Take by mouth.    [provider]  promethazine (PHENERGAN) 25 MG tablet Take by mouth.    [provider]  pyridoxine (B-6) 100 MG tablet     [provider]    Family History Family History  Problem Relation Age of Onset  . Diabetes Father   . Brain cancer Father   . Diabetes Paternal Uncle   . Diabetes Paternal Grandmother   . COPD Mother   . Asthma Mother   . Cervical cancer Mother   . Interstitial cystitis Mother   . Graves' disease Mother     Social History Social History   Tobacco Use  . Smoking status: Light Tobacco Smoker    Types: Cigarettes  . Smokeless tobacco: Never Used  . Tobacco comment: States she quit last week  Substance Use Topics  . Alcohol use: Yes  . Drug use: No     Allergies   Patient has no known allergies.   Review of Systems Review of Systems   Physical Exam Triage Vital Signs ED Triage Vitals  Enc Vitals Group     BP 06/01/20 0946 123/74     Pulse Rate 06/01/20 0946 90     Resp 06/01/20 0946 18     Temp 06/01/20 0946 98.8 F (37.1 C)     Temp src --      SpO2 06/01/20 0946 100 %     Weight --      Height --      Head Circumference --      Peak Flow --      Pain Score 06/01/20 0945 0     Pain Loc --      Pain Edu? --      Excl. in [place]? --    No data found.  Updated Vital Signs BP 123/74   Pulse 90   Temp 98.8 F (37.1 C)   Resp 18   LMP 11/14/2019   SpO2 100%   Visual Acuity Right Eye Distance:   Left Eye Distance:   Bilateral Distance:    Right Eye Near:   Left Eye Near:    Bilateral Near:     Physical Exam Vitals and nursing note reviewed.  Constitutional:      General: She is not in acute distress.    Appearance: She is well-developed. She is not ill-appearing.  HENT:     Head: Normocephalic and atraumatic.  Eyes:     Conjunctiva/sclera: Conjunctivae normal.  Cardiovascular:     Rate and Rhythm: Normal rate and  regular rhythm.     Heart sounds: No murmur heard.    Pulmonary:     Effort: Pulmonary effort is normal. No respiratory distress.     Breath sounds: Wheezing (Occasional scattered wheeze in the lower fields.) present.     Comments: Moving air well saturating 100%. Abdominal:     Palpations: Abdomen is soft.     Tenderness: There is abdominal tenderness (Lower abdominal).  Musculoskeletal:     Cervical back: Neck supple.     Right lower leg: No edema.     Left lower leg: No edema.  Skin:    General: Skin is warm and dry.  Neurological:     Mental Status: She is alert.      UC Treatments / Results  Labs (all labs ordered are listed, but only abnormal results are displayed) Labs Reviewed  SARS CORONAVIRUS 2 (TAT 6-24 HRS)    EKG   Radiology No results found.  Procedures Procedures (including critical care time)  Medications Ordered in UC Medications - No data to display  Initial Impression / Assessment and Plan / UC Course  I have reviewed the triage vital signs and the nursing notes.  Pertinent labs & imaging results that were available during my care of the patient were reviewed by me and considered in my medical decision making (see chart for details).     #URI with cough #Lower abdominal pain #Pregnancy Patient is a 24 year old 15-monthpregnant patient presenting with URI symptoms as well as lower abdominal pain and possible fluid leakage.  Given the abdominal discomfort and possible fluid leakage, she needs further evaluation of the MAU, to rule out premature rupture of membranes and for possible short fetal monitoring.  Discussed this with patient she agrees to report there.  Clinically she is stable for self transport, will defer further management of other symptoms she will report to the MAU for further evaluation.  Patient verbalized agreement understanding plan of care. Final Clinical Impressions(s) / UC Diagnoses   Final diagnoses:  Viral upper respiratory tract infection  Lower abdominal pain   Pregnancy, unspecified gestational age     Discharge Instructions     Following discharge report to the MAU for further assessment of symptoms with Abdominal pain and possible vaginal fluid leakage    ED Prescriptions    None     PDMP not reviewed this encounter.   DPurnell Shoemaker PA-C 06/01/20 1037

## 2020-06-01 NOTE — ED Triage Notes (Addendum)
Pt c/o nasal congestion and cough since Thursday, patient is pregnant and needs information on home meds that are safe to take.

## 2020-06-01 NOTE — Discharge Instructions (Signed)
Safe Medications in Pregnancy    Acne: Benzoyl Peroxide Salicylic Acid  Backache/Headache: Tylenol: 2 regular strength every 4 hours OR              2 Extra strength every 6 hours  Colds/Coughs/Allergies: Benadryl (alcohol free) 25 mg every 6 hours as needed Breath right strips Claritin Cepacol throat lozenges Chloraseptic throat spray Cold-Eeze- up to three times per day Cough drops, alcohol free Flonase (by prescription only) Guaifenesin Mucinex Robitussin DM (plain only, alcohol free) Saline nasal spray/drops Sudafed (pseudoephedrine) & Actifed ** use only after [redacted] weeks gestation and if you do not have high blood pressure Tylenol Vicks Vaporub Zinc lozenges Zyrtec   Constipation: Colace Ducolax suppositories Fleet enema Glycerin suppositories Metamucil Milk of magnesia Miralax Senokot Smooth move tea  Diarrhea: Kaopectate Imodium A-D  *NO pepto Bismol  Hemorrhoids: Anusol Anusol HC Preparation H Tucks  Indigestion: Tums Maalox Mylanta Zantac  Pepcid  Insomnia: Benadryl (alcohol free) 25mg  every 6 hours as needed Tylenol PM Unisom, no Gelcaps  Leg Cramps: Tums MagGel  Nausea/Vomiting:  Bonine Dramamine Emetrol Ginger extract Sea bands Meclizine  Nausea medication to take during pregnancy:  Unisom (doxylamine succinate 25 mg tablets) Take one tablet daily at bedtime. If symptoms are not adequately controlled, the dose can be increased to a maximum recommended dose of two tablets daily (1/2 tablet in the morning, 1/2 tablet mid-afternoon and one at bedtime). Vitamin B6 100mg  tablets. Take one tablet twice a day (up to 200 mg per day).  Skin Rashes: Aveeno products Benadryl cream or 25mg  every 6 hours as needed Calamine Lotion 1% cortisone cream  Yeast infection: Gyne-lotrimin 7 Monistat 7   **If taking multiple medications, please check labels to avoid duplicating the same active ingredients **take  medication as directed on the label ** Do not exceed 4000 mg of tylenol in 24 hours **Do not take medications that contain aspirin or ibuprofen          Preterm Labor and Birth Information  The normal length of a pregnancy is 39-41 weeks. Preterm labor is when labor starts before 37 completed weeks of pregnancy. What are the risk factors for preterm labor? Preterm labor is more likely to occur in women who:  Have certain infections during pregnancy such as a bladder infection, sexually transmitted infection, or infection inside the uterus (chorioamnionitis).  Have a shorter-than-normal cervix.  Have gone into preterm labor before.  Have had surgery on their cervix.  Are younger than age 10 or older than age 105.  Are African American.  Are pregnant with twins or multiple babies (multiple gestation).  Take street drugs or smoke while pregnant.  Do not gain enough weight while pregnant.  Became pregnant shortly after having been pregnant. What are the symptoms of preterm labor? Symptoms of preterm labor include:  Cramps similar to those that can happen during a menstrual period. The cramps may happen with diarrhea.  Pain in the abdomen or lower back.  Regular uterine contractions that may feel like tightening of the abdomen.  A feeling of increased pressure in the pelvis.  Increased watery or bloody mucus discharge from the vagina.  Water breaking (ruptured amniotic sac). Why is it important to recognize signs of preterm labor? It is important to recognize signs of preterm labor because babies who are born prematurely may not be fully developed. This can put them at an increased risk for:  Long-term (chronic) heart and lung problems.  Difficulty immediately after birth with regulating body  systems, including blood sugar, body temperature, heart rate, and breathing rate.  Bleeding in the brain.  Cerebral palsy.  Learning difficulties.  Death. These risks  are highest for babies who are born before 56 weeks of pregnancy. How is preterm labor treated? Treatment depends on the length of your pregnancy, your condition, and the health of your baby. It may involve:  Having a stitch (suture) placed in your cervix to prevent your cervix from opening too early (cerclage).  Taking or being given medicines, such as: ? Hormone medicines. These may be given early in pregnancy to help support the pregnancy. ? Medicine to stop contractions. ? Medicines to help mature the babys lungs. These may be prescribed if the risk of delivery is high. ? Medicines to prevent your baby from developing cerebral palsy. If the labor happens before 34 weeks of pregnancy, you may need to stay in the hospital. What should I do if I think I am in preterm labor? If you think that you are going into preterm labor, call your health care provider right away. How can I prevent preterm labor in future pregnancies? To increase your chance of having a full-term pregnancy:  Do not use any tobacco products, such as cigarettes, chewing tobacco, and e-cigarettes. If you need help quitting, ask your health care provider.  Do not use street drugs or medicines that have not been prescribed to you during your pregnancy.  Talk with your health care provider before taking any herbal supplements, even if you have been taking them regularly.  Make sure you gain a healthy amount of weight during your pregnancy.  Watch for infection. If you think that you might have an infection, get it checked right away.  Make sure to tell your health care provider if you have gone into preterm labor before. This information is not intended to replace advice given to you by your health care provider. Make sure you discuss any questions you have with your health care provider. Document Revised: 01/18/2019 Document Reviewed: 02/17/2016 Elsevier Patient Education  [place].        Premature  Rupture and Preterm Premature Rupture of Membranes  Rupture of membranes is when the membranes (amniotic sac) that hold your baby break open. This is commonly referred to as your "water breaking." If your water breaks before labor starts (prematurely), it is called premature rupture of membranes (PROM). If PROM occurs before 37 weeks of pregnancy, it is called preterm premature rupture of membranes (PPROM). Because the amniotic sac keeps infection out and performs other important functions, having the amniotic sac rupture before 37 weeks of pregnancy can lead to serious problems. It requires immediate attention from a health care provider. What are the causes? When PROM occurs at 37 weeks of pregnancy or later, it is usually caused by natural weakening of the membranes and friction caused by contractions. PPROM is usually caused by infection. In many cases, the cause is not known. What increases the risk of PPROM? The following factors may make you more likely to have PPROM:  Infection.  Having had PPROM in a previous pregnancy.  Short cervical length.  Bleeding during the second or third trimester.  Low BMI, which is an estimate of body fat.  Smoking.  Using drugs.  Low socioeconomic status. What problems can be caused by PROM and PPROM? This condition creates health dangers for the mother and the baby. These include:  Delivering a premature baby.  Getting a serious infection of the placental  tissues (chorioamnionitis).  Early detachment of the placenta from the uterus (placental abruption).  Compression of the umbilical cord.  Developing a serious infection after delivery. What are the signs of PROM and PPROM? Signs of this condition include:  A sudden gush or slow leaking of fluid from the vagina.  Constant wet underwear. Sometimes, women mistake the leaking or wetness for urine, especially if the leak is slow and not a gush of fluid. If there is constant leaking or if  your underwear continues to get wet, your membranes have likely ruptured. What should I do if I think my membranes have ruptured?  Call your health care provider right away.  You will need to go to the hospital immediately to be checked by a health care provider. What happens if I am diagnosed with PROM or PPROM? Once you arrive at the hospital, you will have tests done. A cervical exam will be done using a lubricated instrument (speculum) to check whether the cervix has softened or started to open (dilate).  If you are diagnosed with PROM, your labor may be started for you (you may be induced) within 24 hours if you are not having contractions.  If you are diagnosed with PPROM and you are not having contractions, you may be induced depending on your trimester. If you have PPROM:  You and your baby will be monitored closely for signs of infection or other complications.  You may be given: ? An antibiotic medicine to lower the chances of developing an infection. ? A steroid medicine to help mature the baby's lungs more quickly. ? A medicine to help prevent cerebral palsy in your baby. ? A medicine to stop preterm labor.  You may be ordered to be on bed rest at home or in the hospital.  You may be induced if complications occur for you or the baby. Your treatment will depend on many factors, such as how many weeks you have been pregnant (how far along you are), the development of the baby, and other complications that may occur. This information is not intended to replace advice given to you by your health care provider. Make sure you discuss any questions you have with your health care provider. Document Revised: 01/18/2019 Document Reviewed: 05/02/2016 Elsevier Patient Education  [place].        Fetal Movement Counts Patient Name: ________________________________________________ Patient Due Date: ____________________ What is a fetal movement count?  A fetal movement  count is the number of times that you feel your baby move during a certain amount of time. This may also be called a fetal kick count. A fetal movement count is recommended for every pregnant woman. You may be asked to start counting fetal movements as early as week 28 of your pregnancy. Pay attention to when your baby is most active. You may notice your baby's sleep and wake cycles. You may also notice things that make your baby move more. You should do a fetal movement count:  When your baby is normally most active.  At the same time each day. A good time to count movements is while you are resting, after having something to eat and drink. How do I count fetal movements? 1. Find a quiet, comfortable area. Sit, or lie down on your side. 2. Write down the date, the start time and stop time, and the number of movements that you felt between those two times. Take this information with you to your health care visits. 3. Write down  your start time when you feel the first movement. 4. Count kicks, flutters, swishes, rolls, and jabs. You should feel at least 10 movements. 5. You may stop counting after you have felt 10 movements, or if you have been counting for 2 hours. Write down the stop time. 6. If you do not feel 10 movements in 2 hours, contact your health care provider for further instructions. Your health care provider may want to do additional tests to assess your baby's well-being. Contact a health care provider if:  You feel fewer than 10 movements in 2 hours.  Your baby is not moving like he or she usually does. Date: ____________ Start time: ____________ Stop time: ____________ Movements: ____________ Date: ____________ Start time: ____________ Stop time: ____________ Movements: ____________ Date: ____________ Start time: ____________ Stop time: ____________ Movements: ____________ Date: ____________ Start time: ____________ Stop time: ____________ Movements: ____________ Date:  ____________ Start time: ____________ Stop time: ____________ Movements: ____________ Date: ____________ Start time: ____________ Stop time: ____________ Movements: ____________ Date: ____________ Start time: ____________ Stop time: ____________ Movements: ____________ Date: ____________ Start time: ____________ Stop time: ____________ Movements: ____________ Date: ____________ Start time: ____________ Stop time: ____________ Movements: ____________ This information is not intended to replace advice given to you by your health care provider. Make sure you discuss any questions you have with your health care provider. Document Revised: 05/16/2019 Document Reviewed: 05/16/2019 Elsevier Patient Education  [place].

## 2020-06-01 NOTE — MAU Provider Note (Signed)
History     CSN: [id number]  Arrival date and time: 06/01/20 1020   First Provider Initiated Contact with Patient 06/01/20 1145      Chief Complaint  Patient presents with   Abdominal Pain   Rupture of Membranes   Cough   sinus congestion   Meghan Riggs is a 24 y.o. G1P0 at 70w4dwho presents to MAU for possible ROM. Patient reports yesterday evening when she was driving she was coughing and she could not stop coughing and she experienced a large gush of fluid that was odorless and required her to change her clothing because she was soaked through her panty liner, underwear and leggings. 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 Patient reports she has had issues in the past when she would "laugh too hard" and lose urine, but reports it was never this much. Patient reports she has continued to experience gushes of fluid only when coughing, and denies any leaking of fluid between coughing episodes. Patient reports the fluid is always clear and odorless. Patient was seen at Urgent Care earlier this morning for URI symptoms and has been tested for COVID. Patient denies intercourse in the past 24 hours. Patient also reports a soreness across her lower abdomen.  Pt denies VB, LOF, ctx, decreased FM, vaginal odor/itching. Pt denies N/V, constipation, diarrhea, or urinary problems. Pt denies fever, chills, fatigue, sweating or changes in appetite. Pt denies SOB or chest pain. Pt denies dizziness, HA, light-headedness, weakness.  Problems this pregnancy include: pre-diabetes, anemia, asthma. Allergies? NKDA Current medications/supplements? Albuterol, Pepcid, iron, insulin x2, claritin, zofran, PNV, protonix, Phenergan Prenatal care provider? [facility], next appt 06/04/2020   OB History    Gravida  1   Para      Term      Preterm      AB      Living        SAB       TAB      Ectopic      Multiple      Live Births              Past Medical History:  Diagnosis Date   Asthma    Dr. SDonneta Romberg  Club foot 1996   Congenital   Hyperlipidemia     Past Surgical History:  Procedure Laterality Date   APPENDECTOMY     CLUB FOOT RELEASE  [id number]  @ 344days old   KNEE SURGERY     LAPAROSCOPIC APPENDECTOMY N/A 06/04/2017   Procedure: APPENDECTOMY LAPAROSCOPIC;  Surgeon: CErroll Luna MD;  Location: [facility]  Service: General;  Laterality: N/A;    Family History  Problem Relation Age of Onset   Diabetes Father    Brain cancer Father    Diabetes Paternal Uncle    Diabetes Paternal Grandmother    COPD Mother    Asthma Mother    Cervical cancer Mother    Interstitial cystitis Mother    GBerenice Primas disease Mother     Social History   Tobacco Use   Smoking status: Light Tobacco Smoker    Types: Cigarettes   Smokeless tobacco: Never Used   Tobacco comment: States she quit last week  Substance Use Topics   Alcohol use: Yes   Drug use: No    Allergies: No Known Allergies  Medications Prior to Admission  Medication Sig Dispense Refill  Last Dose   ferrous sulfate 324 MG TBEC Take 324 mg by mouth.   05/31/2020 at Unknown time   insulin regular (NOVOLIN R) 100 units/mL injection Inject into the skin 3 (three) times daily before meals.   06/01/2020 at Unknown time   ondansetron (ZOFRAN) 8 MG tablet ondansetron HCl 8 mg tablet  TAKE 1 TABLET BY MOUTH EVERY 8 HOURS AS NEEDED NAUSEA AND VOMITING   Past Week at Unknown time   pantoprazole (PROTONIX) 40 MG tablet Take by mouth.   06/01/2020 at Unknown time   PRENATAL VIT-FE FUM-FA-FAT AC PO Take by mouth.   06/01/2020 at Unknown time   Accu-Chek Softclix Lancets lancets Use 4 lancets daily.      albuterol (PROVENTIL HFA;VENTOLIN HFA) 108 (90 Base) MCG/ACT inhaler Inhale 1-2 puffs into the lungs every 6 (six) hours as needed for wheezing or shortness of breath. 1 Inhaler 3     Blood Glucose Monitoring Suppl (ACCU-CHEK GUIDE) w/Device KIT Use 4 times daily to monitor blood sugars      famotidine (PEPCID) 10 MG tablet Take 10 mg by mouth 2 (two) times daily.      glucose blood (ACCU-CHEK GUIDE) test strip Use to monitor blood glucose 4 time(s) daily      guaiFENesin (ROBITUSSIN) 100 MG/5ML liquid Take 5-10 mLs (100-200 mg total) by mouth every 4 (four) hours as needed for cough. 60 mL 0    loratadine (CLARITIN) 10 MG tablet Take 1 tablet (10 mg total) by mouth daily. 30 tablet 1    Loratadine 10 MG CAPS loratadine 10 mg capsule      ondansetron (ZOFRAN-ODT) 4 MG disintegrating tablet Take by mouth.      promethazine (PHENERGAN) 25 MG tablet Take by mouth.   More than a month at Unknown time   pyridoxine (B-6) 100 MG tablet        Review of Systems  Constitutional: Negative for chills, diaphoresis, fatigue and fever.  Eyes: Negative for visual disturbance.  Respiratory: Positive for cough. Negative for shortness of breath.   Cardiovascular: Negative for chest pain.  Gastrointestinal: Positive for abdominal pain. Negative for constipation, diarrhea, nausea and vomiting.  Genitourinary: Positive for vaginal discharge. Negative for dysuria, flank pain, frequency, pelvic pain, urgency and vaginal bleeding.  Neurological: Negative for dizziness, weakness, light-headedness and headaches.   Physical Exam   Blood pressure 120/79, pulse 81, temperature 98.2 F (36.8 C), temperature source Oral, resp. rate 18, height 5' (1.524 m), weight 96.3 kg, last menstrual period 11/18/2019, SpO2 99 %.  Patient Vitals for the past 24 hrs:  BP Temp Temp src Pulse Resp SpO2 Height Weight  06/01/20 1051 120/79 98.2 F (36.8 C) Oral 81 18 99 % 5' (1.524 m) 96.3 kg   Physical Exam Vitals and nursing note reviewed. Exam conducted with a chaperone present.  Constitutional:      General: She is not in acute distress.    Appearance: Normal appearance. She is obese. She is not  ill-appearing, toxic-appearing or diaphoretic.  HENT:     Head: Normocephalic and atraumatic.  Pulmonary:     Effort: Pulmonary effort is normal.  Abdominal:     General: There is no distension.     Palpations: Abdomen is soft. There is no mass.     Tenderness: There is no abdominal tenderness. There is no guarding or rebound.  Genitourinary:    General: Normal vulva.     Labia:        Right: No rash, tenderness  or lesion.        Left: No rash, tenderness or lesion.      Comments: Dilation: Closed Effacement (%): Thick Cervical Position: Posterior Station: Ballotable Presentation: Undeterminable Exam by:: NN  Urine odor present at perineum on evaluation.  Skin:    General: Skin is warm and dry.  Neurological:     Mental Status: She is alert and oriented to person, place, and time.  Psychiatric:        Mood and Affect: Mood normal.        Behavior: Behavior normal.        Thought Content: Thought content normal.        Judgment: Judgment normal.    Results for orders placed or performed during the hospital encounter of 06/01/20 (from the past 24 hour(s))  Urinalysis, Routine w reflex microscopic Urine, Clean Catch     Status: Abnormal   Collection Time: 06/01/20 12:02 PM  Result Value Ref Range   Color, Urine YELLOW YELLOW   APPearance CLOUDY (A) CLEAR   Specific Gravity, Urine 1.017 1.005 - 1.030   pH 6.0 5.0 - 8.0   Glucose, UA NEGATIVE NEGATIVE mg/dL   Hgb urine dipstick NEGATIVE NEGATIVE   Bilirubin Urine NEGATIVE NEGATIVE   Ketones, ur NEGATIVE NEGATIVE mg/dL   Protein, ur NEGATIVE NEGATIVE mg/dL   Nitrite NEGATIVE NEGATIVE   Leukocytes,Ua NEGATIVE NEGATIVE  Glucose, capillary     Status: None   Collection Time: 06/01/20 12:31 PM  Result Value Ref Range   Glucose-Capillary 73 70 - 99 mg/dL  Amnisure rupture of membrane (rom)not at [facility]     Status: None   Collection Time: 06/01/20 12:33 PM  Result Value Ref Range   Amnisure ROM NEGATIVE   Wet prep, genital      Status: Abnormal   Collection Time: 06/01/20 12:33 PM  Result Value Ref Range   Yeast Wet Prep HPF POC NONE SEEN NONE SEEN   Trich, Wet Prep NONE SEEN NONE SEEN   Clue Cells Wet Prep HPF POC PRESENT (A) NONE SEEN   WBC, Wet Prep HPF POC FEW (A) NONE SEEN   Sperm NONE SEEN   Fetal fibronectin     Status: None   Collection Time: 06/01/20 12:33 PM  Result Value Ref Range   Fetal Fibronectin NEGATIVE NEGATIVE   No results found.  MAU Course  Procedures  MDM -pt sent from Urgent Care for r/o PPROM -pt seen in Urgent Care earlier today for URI symptoms Dilation: Closed Effacement (%): Thick Cervical Position: Posterior Station: Ballotable Presentation: Undeterminable Exam by:: NN -UA: cloudy, otherwise WNL -POCT CBG: 73 -AmniSure: negative -fFN: negative -WetPrep: +ClueCells (isolated finding not requiring treatment) -GC/CT collected -EFM: reactive with decels       -baseline: 155       -variability: moderate       -accels: present, 15x15       -decels: 1156, 1236, 1312, 1400       -TOCO: quiet -BPP: 8/8 -consulted with Dr. Rosana Hoes regarding fetal decelerations and BPP results -pt discharged to home in stable condition   Orders Placed This Encounter  Procedures   Wet prep, genital    Standing Status:   Standing    Number of Occurrences:   1   [name] MFM FETAL BPP WO NON STRESS    Standing Status:   Standing    Number of Occurrences:   1    Order Specific Question:   Symptom/Reason for Exam  Answer:   Fetal heart rate decelerations affecting management of mother [phone]   Urinalysis, Routine w reflex microscopic Urine, Clean Catch    Standing Status:   Standing    Number of Occurrences:   1   Amnisure rupture of membrane (rom)not at [facility]    Standing Status:   Standing    Number of Occurrences:   1   Fetal fibronectin    Standing Status:   Standing    Number of Occurrences:   1   Glucose, capillary    Standing Status:   Standing    Number of Occurrences:    1   Discharge patient    Order Specific Question:   Discharge disposition    Answer:   01-Home or Self Care [1]    Order Specific Question:   Discharge patient date    Answer:   06/01/2020    No orders of the defined types were placed in this encounter.   Assessment and Plan   1. Encounter for suspected premature rupture of amniotic membranes, with rupture of membranes not found   2. Fetal heart rate decelerations affecting management of mother   3. Symptoms of upper respiratory infection (URI)   4. NST (non-stress test) reactive   5. [redacted] weeks gestation of pregnancy     Allergies as of 06/01/2020   No Known Allergies     Medication List    TAKE these medications   Accu-Chek Guide test strip Generic drug: glucose blood Use to monitor blood glucose 4 time(s) daily   Accu-Chek Guide w/Device Kit Use 4 times daily to monitor blood sugars   Accu-Chek Softclix Lancets lancets Use 4 lancets daily.   albuterol 108 (90 Base) MCG/ACT inhaler Commonly known as: VENTOLIN HFA Inhale 1-2 puffs into the lungs every 6 (six) hours as needed for wheezing or shortness of breath.   famotidine 10 MG tablet Commonly known as: PEPCID Take 10 mg by mouth 2 (two) times daily.   ferrous sulfate 324 MG Tbec Take 324 mg by mouth.   guaiFENesin 100 MG/5ML liquid Commonly known as: ROBITUSSIN Take 5-10 mLs (100-200 mg total) by mouth every 4 (four) hours as needed for cough.   insulin regular 100 units/mL injection Commonly known as: NOVOLIN R Inject into the skin 3 (three) times daily before meals.   Loratadine 10 MG Caps loratadine 10 mg capsule   loratadine 10 MG tablet Commonly known as: CLARITIN Take 1 tablet (10 mg total) by mouth daily.   ondansetron 4 MG disintegrating tablet Commonly known as: ZOFRAN-ODT Take by mouth.   ondansetron 8 MG tablet Commonly known as: ZOFRAN ondansetron HCl 8 mg tablet  TAKE 1 TABLET BY MOUTH EVERY 8 HOURS AS NEEDED NAUSEA AND VOMITING    pantoprazole 40 MG tablet Commonly known as: PROTONIX Take by mouth.   PRENATAL VIT-FE FUM-FA-FAT AC PO Take by mouth.   promethazine 25 MG tablet Commonly known as: PHENERGAN Take by mouth.   pyridoxine 100 MG tablet Commonly known as: B-6       -pt seen at Urgent Care this morning for URI symptoms -safe meds in pregnancy list given -advised to be seen in ED/UC for worsening s/sx of URI -pt to keep appt with OB on Thursday -return MAU precautions given -pt discharged to home in stable condition   Elmyra Ricks E Kaemon Barnett 06/01/2020, 3:00 PM

## 2020-06-01 NOTE — ED Notes (Signed)
Patient is being discharged from the Urgent Care and sent to the Emergency Department via personal vehicle . Per provider Roland Rack, patient is in need of higher level of care due to pregnancy complications. Patient is aware and verbalizes understanding of plan of care.  Vitals:   06/01/20 0946  BP: 123/74  Pulse: 90  Resp: 18  Temp: 98.8 F (37.1 C)  SpO2: 100%

## 2020-06-01 NOTE — Discharge Instructions (Addendum)
Following discharge report to the MAU for further assessment of symptoms with Abdominal pain and possible vaginal fluid leakage

## 2020-06-02 LAB — GC/CHLAMYDIA PROBE AMP (~~LOC~~) NOT AT ARMC
Chlamydia: NEGATIVE
Comment: NEGATIVE
Comment: NORMAL
Neisseria Gonorrhea: NEGATIVE

## 2021-01-15 DIAGNOSIS — F4310 Post-traumatic stress disorder, unspecified: Secondary | ICD-10-CM | POA: Insufficient documentation

## 2021-01-15 DIAGNOSIS — F1210 Cannabis abuse, uncomplicated: Secondary | ICD-10-CM | POA: Insufficient documentation

## 2021-01-15 DIAGNOSIS — F410 Panic disorder [episodic paroxysmal anxiety] without agoraphobia: Secondary | ICD-10-CM | POA: Insufficient documentation

## 2021-04-13 ENCOUNTER — Encounter: Payer: Self-pay | Admitting: *Deleted

## 2021-05-07 ENCOUNTER — Telehealth: Payer: Self-pay

## 2021-05-07 NOTE — Telephone Encounter (Signed)
Question about COVID booster, please call pt back.

## 2021-05-07 NOTE — Telephone Encounter (Signed)
Returned call to patient. States she lives with fiance, a 43 month old, and 26 year old. She has received first 2 Covid vaccines but not the booster. Fiance had no vaccines and was diagnosed with Covid yesterday in ED. Patient and children do not have any symptoms. She has 2 questions:  She has a doctor's appt today. Can she go in person or does she need to quarantine?  2.  Can she receive the booster vaccine today?

## 2021-05-07 NOTE — Telephone Encounter (Signed)
Patient notified of below. Explained that it does take 2 weeks for an immune response to any vaccine, including Covid booster, so even if she received it today, she would not be protected today. She was very [employer] attorney.

## 2021-07-23 DIAGNOSIS — F330 Major depressive disorder, recurrent, mild: Secondary | ICD-10-CM | POA: Insufficient documentation

## 2021-09-07 ENCOUNTER — Encounter: Payer: Medicaid Other | Admitting: Internal Medicine

## 2021-09-08 ENCOUNTER — Ambulatory Visit (INDEPENDENT_AMBULATORY_CARE_PROVIDER_SITE_OTHER): Payer: Medicaid Other | Admitting: Internal Medicine

## 2021-09-08 ENCOUNTER — Telehealth: Payer: Self-pay

## 2021-09-08 ENCOUNTER — Other Ambulatory Visit: Payer: Self-pay

## 2021-09-08 ENCOUNTER — Encounter: Payer: Self-pay | Admitting: Internal Medicine

## 2021-09-08 VITALS — BP 142/74 | HR 110 | Temp 98.3°F | Ht 61.0 in | Wt 248.0 lb

## 2021-09-08 DIAGNOSIS — D1801 Hemangioma of skin and subcutaneous tissue: Secondary | ICD-10-CM | POA: Diagnosis not present

## 2021-09-08 DIAGNOSIS — K2950 Unspecified chronic gastritis without bleeding: Secondary | ICD-10-CM

## 2021-09-08 DIAGNOSIS — R194 Change in bowel habit: Secondary | ICD-10-CM

## 2021-09-08 DIAGNOSIS — K921 Melena: Secondary | ICD-10-CM

## 2021-09-08 DIAGNOSIS — K649 Unspecified hemorrhoids: Secondary | ICD-10-CM

## 2021-09-08 DIAGNOSIS — N61 Mastitis without abscess: Secondary | ICD-10-CM

## 2021-09-08 MED ORDER — HYDROCORTISONE ACETATE 25 MG RE SUPP: 25.0000 mg | Freq: Two times a day (BID) | RECTAL | 0 refills | Status: AC

## 2021-09-08 MED ORDER — SULFAMETHOXAZOLE-TRIMETHOPRIM 800-160 MG PO TABS: 1.0000 | ORAL_TABLET | Freq: Two times a day (BID) | ORAL | 0 refills | Status: AC

## 2021-09-08 MED ORDER — PANTOPRAZOLE SODIUM 40 MG PO TBEC: 40.0000 mg | DELAYED_RELEASE_TABLET | Freq: Two times a day (BID) | ORAL | 2 refills | Status: DC

## 2021-09-08 NOTE — Telephone Encounter (Signed)
Pa for pt ( HYDROCORTISONE ACETATE 25 MG SUPPOSITORIES )  came through on cover my meds was done and sent back with office notes from 11/30.Marland Kitchen  Awaiting approval or denial

## 2021-09-08 NOTE — Assessment & Plan Note (Signed)
Meghan Riggs states that she has noticed a red dot on her stomach that turns lighter if she rubs it becomes dark again.  She was worried that this was an area of infection or bleeding.  Assessment/plan: On examination, there is evidence of innumerable punctate cherry angiomas on her abdomen.  No angiomas located elsewhere including oral or lip.  Suspect that this is benign in nature and likely related to her pregnancy in the last year.  No further intervention at this time, but will continue to monitor at future visits.

## 2021-09-08 NOTE — Assessment & Plan Note (Signed)
Meghan Riggs states that she has always had frequent bowel movements since childhood.  She describes approximately 3-4 loose bowel movements per day.  Frequently after eating, she has a bowel movement within 15 to 20 minutes.  She endorses epigastric abdominal pain that she felt was secondary to gastritis.  Occasionally the pain does improve with defecation but not always.  She endorses tenesmus.  She denies any fever, chills, unintentional weight loss, melena.  She has noticed hematochezia (please see separate assessment/plan) and mucus in her stool.  Assessment/plan: Abdominal examination is reassuring today.  Given longstanding history of symptoms, suspect that this is functional diarrhea versus IBS with diarrhea predominance.  We are already referring her to GI for several other issues.    - Avoid Imodium due to hemorrhoids

## 2021-09-08 NOTE — Patient Instructions (Addendum)
It was nice seeing you today! Thank you for choosing [facility] for your Primary Care.    Today we talked about:   Hemorrhoids:  I have placed a referral to GI I am also sending in a steroid suppository. You can use it twice per day for no more than 7 days. If you continue to have pain/bleeding afterwards, I recommend Preparation H suppositories to be used up to 4 times per day.   Gastritis, chronic:  Continue to take Protonix 40 mg twice daily. Take 30 minutes before you eat.  I have also placed a referral to GI for this  Boil on breast:  I have sent in a prescription for an antibiotic called Bactrim. Take 1 tablet twice per day for 5 days.

## 2021-09-08 NOTE — Assessment & Plan Note (Signed)
Ms. Creely states that approximately 2 weeks ago, she noticed a boil developing on her left breast.  She has a history of boils in other locations and did not think much of it at the time.  She started applying warm compresses and noticed significant purulent discharge.  Since that time, the area has remained red and continues to have purulent discharge.  She denies any fever, chills.  Assessment/plan: Examination consistent with cellulitis with underlying abscess.  Given that the abscess is open and draining, no indication for lancing at this time.  - Bactrim DS twice daily for 5 days

## 2021-09-08 NOTE — Assessment & Plan Note (Addendum)
Ms. Paddack states that she has a history of chronic rectal bleeding that she has always attribute any to her hemorrhoids.  Occasionally, she has noticed that blood is mixed into her stool though.  She denies any personal or family history of IBD.  She does have a strong family history of Graves' disease in her family though; no other autoimmune disorders in her family.  Assessment/plan: Hematochezia is likely secondary to hemorrhoids, however given patient's young age and family history of autoimmune disorders, will obtain a CRP and ESR to start evaluation for IBD.  We are also referring her to gastroenterology.  - Referral to GI placed - CRP and ESR pending  ADDENDUM:  CRP mildly elevated with normal ESR. No change in management at this time. Will still need to follow up with GI.

## 2021-09-08 NOTE — Assessment & Plan Note (Addendum)
Meghan Riggs states that she has a long-term history of epigastric abdominal pain with nausea in the morning and a foul taste in her mouth.  She went to a GI doctor after her symptoms worsened during pregnancy, who recommended Protonix.  She did not feel like this physician " took her complaint seriously." She is currently taking Protonix now twice a day without significant improvement in her symptoms.  She has cut out alcohol, decreased her intake of spicy food and avoids other acidic foods as well.  She has tried a complete bland diet before without any difference in her symptoms.  She denies any frank melena but does that her stool is occasionally very dark.  She denies any unintentional weight loss, vomiting, hematemesis.  Occasionally, she does feel that food gets stuck in her throat but always passes eventually.  Assessment/plan: On examination, patient has some epigastric tenderness to palpation.  Overall history is consistent with chronic gastritis of undetermined etiology.  Given that symptoms have been refractory to maximum PPI use, Meghan Riggs may ultimately require an endoscopy for further evaluation.  Unable to test for H. pylori today due to chronic PPI use.  - Continue Protonix 40 mg twice daily - Referral to GI placed  ADDENDUM:  Meghan Riggs contacted to discuss lab results. She recalls severe bilateral upper quadrant and epigastric abdominal pain over the last couple days. Discussed addition of Carafate. She is in agreement. Carafate 1g QID sent to pharmacy.

## 2021-09-08 NOTE — Assessment & Plan Note (Addendum)
Meghan Riggs states that approximately 1 year ago, around the time that she became anxious, she developed hemorrhoids.  She first noticed an external hemorrhoid.  She has been experiencing significant rectal pain in addition to bleeding with every bowel movement.  She states occasionally she feels the need to have a bowel movement but is unable to do so.  When this occurs, she also continues to have bleeding.  Bleeding is usually overlying her stool in the toilet bowl or on the toilet paper, however it is occasionally mixed into her stool.  She is unsure if she has any internal hemorrhoids.  She has been using Preparation H with some improvement in pain, but continues to have bleeding with every bowel movement.  Meghan Riggs denies any history of constipation, stating she has 3-4 loose bowel movements per day.  This is a chronic issue for her.  Assessment/plan: Examination today consistent with several external hemorrhoids that are tender, however only one internal hemorrhoid palpated that was stage II.  Given extensive daily rectal bleeding, will obtain a CBC to evaluate for anemia.  We will also place a referral to GI for consideration of banding.  We will start steroid suppositories today and have instructed Meghan Riggs to start Preparation H suppositories if she continues to have rectal bleeding after completing course of steroid suppository.  - CBC pending - Referral to GI placed - Start Anusol suppositories twice daily for no more than 7 days - Continue Preparation H cream - Consider Preparation H suppositories if continues to be symptomatic after 1 to 2 weeks  ADDENDUM: CBC without evidence of anemia.

## 2021-09-08 NOTE — Progress Notes (Signed)
   CC: Hemorrhoids  HPI:  Meghan Riggs is a 25 y.o. with a PMHx as listed below who presents to the clinic for hemorrhoids.   Please see the Encounters tab for problem-based Assessment & Plan regarding status of patient's acute and chronic conditions.  Past Medical History:  Diagnosis Date   Asthma    Dr. Donneta Romberg   Club foot 1996   Congenital   Hyperlipidemia    Review of Systems: Review of Systems  Constitutional:  Negative for chills, fever, malaise/fatigue and weight loss.  Respiratory:  Negative for shortness of breath.   Cardiovascular:  Negative for chest pain and palpitations.  Gastrointestinal:  Positive for abdominal pain, blood in stool, diarrhea, heartburn and nausea. Negative for constipation, melena and vomiting.  Genitourinary:  Negative for dysuria, frequency, hematuria and urgency.  Skin:  Positive for itching (under breasts).       + boil on left breast  Neurological:  Negative for dizziness.  Psychiatric/Behavioral:  Positive for depression. The patient is nervous/anxious.    Physical Exam:  Vitals:   09/08/21 0908  BP: (!) 142/74  Pulse: (!) 110  Temp: 98.3 F (36.8 C)  TempSrc: Oral  SpO2: 100%  Weight: 248 lb (112.5 kg)  Height: 5\' 1"  (1.549 m)   Physical Exam Vitals and nursing note reviewed.  Constitutional:      Appearance: She is morbidly obese. She is not ill-appearing.  HENT:     Head: Normocephalic and atraumatic.  Eyes:     Extraocular Movements: Extraocular movements intact.     Pupils: Pupils are equal, round, and reactive to light.  Neck:     Thyroid: No thyroid mass, thyromegaly or thyroid tenderness.  Pulmonary:     Effort: Pulmonary effort is normal. No respiratory distress.  Abdominal:     General: Bowel sounds are normal. There is no distension.     Palpations: Abdomen is soft. There is no mass.     Tenderness: There is no abdominal tenderness. There is no guarding.     Hernia: No hernia is present.     Comments:   External rectal examination with at least 4 external hemorrhoids that are tender to palpation but appear non-thrombosed. At least one internal hemorrhoid palpated, however internal examination limited by patient discomfort.   Musculoskeletal:     Cervical back: Normal range of motion and neck supple.  Skin:    General: Skin is warm and dry.     Comments:  In the lower outer quadrant of the left breast, there is an area of erythema approximately 1 x 1 cm. In the middle, there is a punctate opening. Underlying, there is an area of induration approximately 0.5 x 0.5 cm that is mobile, non-tender.   Abdominal examination with innumerable punctate, blanchable, flat angiomas   Neurological:     General: No focal deficit present.     Mental Status: She is alert and oriented to person, place, and time. Mental status is at baseline.  Psychiatric:        Mood and Affect: Mood normal.        Behavior: Behavior normal. Behavior is cooperative.        Thought Content: Thought content normal.        Judgment: Judgment normal.    Assessment & Plan:   See Encounters Tab for problem based charting.  Patient discussed with Dr. Heber Harman

## 2021-09-09 LAB — CBC
Hematocrit: 37.4 % (ref 34.0–46.6)
Hemoglobin: 12.9 g/dL (ref 11.1–15.9)
MCH: 29.5 pg (ref 26.6–33.0)
MCHC: 34.5 g/dL (ref 31.5–35.7)
MCV: 86 fL (ref 79–97)
Platelets: 288 10*3/uL (ref 150–450)
RBC: 4.37 x10E6/uL (ref 3.77–5.28)
RDW: 13 % (ref 11.7–15.4)
WBC: 10.9 10*3/uL — ABNORMAL HIGH (ref 3.4–10.8)

## 2021-09-09 LAB — TSH: TSH: 4.09 u[IU]/mL (ref 0.450–4.500)

## 2021-09-09 LAB — SEDIMENTATION RATE: Sed Rate: 22 mm/hr (ref 0–32)

## 2021-09-09 LAB — C-REACTIVE PROTEIN: CRP: 12 mg/L — ABNORMAL HIGH (ref 0–10)

## 2021-09-09 NOTE — Telephone Encounter (Signed)
DECISION :   Deniedon November 30  PA Case: [id number], Status: Denied.  Notification: Completed.    No info on to why it was denied but pt does have [employer] medicaid  and when I check the preferred drug list the medicine is a non preferred drug

## 2021-09-10 NOTE — Progress Notes (Signed)
Internal Medicine Clinic Attending  Case discussed with Dr. Charleen Kirks    At the time of the visit.  We reviewed the resident's history and exam and pertinent patient test results.  I agree with the assessment, diagnosis, and plan of care documented in the resident's note. Her CRP is elevated however difficult to interpret significance since she also has a current cutaneous abscess.

## 2021-09-13 MED ORDER — SUCRALFATE 1 G PO TABS: 1.0000 g | ORAL_TABLET | Freq: Three times a day (TID) | ORAL | 0 refills | Status: DC

## 2021-09-13 NOTE — Addendum Note (Signed)
Addended by: Jose Persia on: 09/13/2021 09:56 AM   Modules accepted: Orders

## 2021-09-21 ENCOUNTER — Encounter: Payer: Self-pay | Admitting: *Deleted

## 2021-09-23 ENCOUNTER — Ambulatory Visit (INDEPENDENT_AMBULATORY_CARE_PROVIDER_SITE_OTHER): Payer: Medicaid Other | Admitting: Internal Medicine

## 2021-09-23 ENCOUNTER — Encounter: Payer: Self-pay | Admitting: Internal Medicine

## 2021-09-23 ENCOUNTER — Other Ambulatory Visit (INDEPENDENT_AMBULATORY_CARE_PROVIDER_SITE_OTHER): Payer: Medicaid Other

## 2021-09-23 VITALS — BP 126/80 | HR 104 | Ht 60.0 in | Wt 245.2 lb

## 2021-09-23 DIAGNOSIS — R1013 Epigastric pain: Secondary | ICD-10-CM

## 2021-09-23 DIAGNOSIS — K921 Melena: Secondary | ICD-10-CM

## 2021-09-23 DIAGNOSIS — R197 Diarrhea, unspecified: Secondary | ICD-10-CM | POA: Diagnosis not present

## 2021-09-23 DIAGNOSIS — K625 Hemorrhage of anus and rectum: Secondary | ICD-10-CM

## 2021-09-23 MED ORDER — SUTAB 1479-225-188 MG PO TABS: ORAL_TABLET | ORAL | 0 refills | Status: DC

## 2021-09-23 NOTE — Progress Notes (Signed)
Chief Complaint: Abdominal pain, constipation, hemorrhoids  HPI : 25 year old female with history of asthma and benign liver cyst presents with abdominal pain.  She has had numerous GI symptoms over the last year. When she was pregnant (in 2021), she saw a previous GI physician that she did not feel like took her seriously and attributed most of her GI symptoms to her pregnancy. Whenever she eats, she goes to the bathroom. She has to go to the bathroom as soon as she eats. Sometimes going to the bathroom irritates her hemorrhoids. She has on average 3-5 BMs per day. She did try to have her baby vaginally, but she had to go to C-section in the end. She had N&V with her pregnancy for which she took Zofran. She has also had issues with ab pain. Pain will be sometimes in her chest and sometimes in her epigastric area.  She recently increased the dose of her PPI to try to help with the pain. The increase in her PPI from 20 mg BID to 40 mg BID has helped somewhat with the pain. She has tried a bland diet to avoid GERD issues. She had issues with constipation during her pregnancy. Her OB recommended that she start taking Miralax when she was pregnant. However, now she is having issues with diarrhea. She is noticing red and black in her stools. Sometimes when she wipes, she thought she was on her period, except the blood was coming from her rectum. The blood would also be covering the toilet. Denies use of blood tihnners. She takes ibuprofen twice weekly. Her mother has ulcers from her esophagus down to there stomach. She has not started on carafate yet. She does have history of significant alcohol use (heavy drinking particularly only the weekends), but she has cut down significantly and now only drinks occasionally.  Wt Readings from Last 3 Encounters:  09/23/21 245 lb 4 oz (111.2 kg)  09/08/21 248 lb (112.5 kg)  06/01/20 212 lb 4.8 oz (96.3 kg)   Past Medical History:  Diagnosis Date   Anemia    Anxiety     Arthritis    Asthma    Dr. Donneta Romberg   Benign liver cyst    Club foot 1996   Congenital   Depression    DM (diabetes mellitus) ([place])    Gastritis    HTN (hypertension)    Hyperlipidemia    Internal hemorrhoids    Osteoarthritis      Past Surgical History:  Procedure Laterality Date   AXILLARY LYMPH NODE DISSECTION Left 2006   CESAREAN SECTION  07/2020   CLUB FOOT RELEASE Bilateral 767   @ 57 days old   KNEE SURGERY Left 2010   LAPAROSCOPIC APPENDECTOMY N/A 06/04/2017   Procedure: APPENDECTOMY LAPAROSCOPIC;  Surgeon: Erroll Luna, MD;  Location: [place];  Service: General;  Laterality: N/A;   Family History  Problem Relation Age of Onset   COPD Mother    Asthma Mother    Cervical cancer Mother    Interstitial cystitis Mother    Berenice Primas' disease Mother    Diabetes Father    Brain cancer Father    Heart failure Father    Club foot Brother    COPD Maternal Grandmother    Asthma Maternal Grandmother    Cirrhosis Maternal Grandfather    Diabetes Paternal Grandmother    Breast cancer Paternal Grandmother    Heart disease Paternal Grandfather    Diabetes Paternal Uncle    Colon cancer  Neg Hx    Colon polyps Neg Hx    Social History   Tobacco Use   Smoking status: Light Smoker    Types: Cigarettes   Smokeless tobacco: Never   Tobacco comments:    States she quit last week  Vaping Use   Vaping Use: Never used  Substance Use Topics   Alcohol use: Yes    Comment: seldom   Drug use: No    Comment: hx marijuana use per [facility] record 01/29/20   Current Outpatient Medications  Medication Sig Dispense Refill   albuterol (PROVENTIL HFA;VENTOLIN HFA) 108 (90 Base) MCG/ACT inhaler Inhale 1-2 puffs into the lungs every 6 (six) hours as needed for wheezing or shortness of breath. 1 Inhaler 3   FLUoxetine (PROZAC) 40 MG capsule Take 40 mg by mouth daily.     hydrOXYzine (ATARAX) 10 MG tablet Take 10 mg by mouth 3 (three) times daily.     hydrOXYzine (ATARAX) 50 MG tablet  Take 50 mg by mouth at bedtime.     pantoprazole (PROTONIX) 40 MG tablet Take 1 tablet (40 mg total) by mouth 2 (two) times daily. 60 tablet 2   Sodium Sulfate-Mag Sulfate-KCl (SUTAB) [phone] MG TABS Use as directed for colonoscopy. MANUFACTURER CODES!! BIN: [id number] PCN: CN GROUP: [id number] MEMBER ID: [id number];[employer] AS SECONDARY INSURANCE ;NO PRIOR AUTHORIZATION 24 tablet 0   sucralfate (CARAFATE) 1 g tablet Take 1 tablet (1 g total) by mouth 4 (four) times daily -  with meals and at bedtime. (Patient not taking: Reported on 09/23/2021) 120 tablet 0   No current facility-administered medications for this visit.   No Known Allergies   Review of Systems: All systems reviewed and negative except where noted in HPI.   Physical Exam: BP 126/80 (BP Location: Left Arm, Patient Position: Sitting, Cuff Size: Normal)    Pulse (!) 104    Ht 5' (1.524 m)    Wt 245 lb 4 oz (111.2 kg)    LMP 08/19/2021    Breastfeeding No    BMI 47.90 kg/m  Constitutional: Pleasant,well-developed, female in no acute distress. HEENT: Normocephalic and atraumatic. Conjunctivae are normal. No scleral icterus. Cardiovascular: Normal rate, regular rhythm.  Pulmonary/chest: Effort normal and breath sounds normal. No wheezing, rales or rhonchi. Abdominal: Soft, nondistended, tender in the epigastric area Bowel sounds active throughout. There are no masses palpable. No hepatomegaly. Extremities: No edema Neurological: Alert and oriented to person place and time. Skin: Skin is warm and dry. No rashes noted. Psychiatric: Normal mood and affect. Behavior is normal.  Labs 02/2021: CBC nml.  Labs 08/2021: CRP mildly elevated at 12 (nml <10). CBC with mild leukocytosis of 10.9. ESR nml. TSH nml.  CT A/P w/contrast 06/04/17: IMPRESSION: Appendix is normal in size but notable for mild periappendiceal stranding. In the appropriate clinical setting, very early acute appendicitis could have this appearance. Consider  surgical evaluation as clinically warranted. No drainable fluid collection/abscess.  No free air. Trace pelvic ascites.  RUQ U/S 11/25/19: IMPRESSION: 1. Normal sonographic appearance of the gallbladder and biliary tree. 2. Hepatic steatosis.  CT A/P w/contrast 08/10/20: IMPRESSION:  1.  Complex fluid and pockets of gas within postpartum endometrial canal may represent retained products of conception. C-section surgical defect is identified in mid anterior myometrium. Follow-up recommended.  2.  Small bilateral pleural effusions. Bibasilar opacities may represent atelectasis or infiltrates.   ASSESSMENT AND PLAN:  Epigastric abdominal pain Diarrhea Hemorrhoids Rectal bleeding Melena Patient presents with numerous GI complaints today. Has noted  issues with abdominal pain, diarrhea, rectal bleeding, and melena. Has had some response to PPI therapy but despite maximal dose of PPIs she continues to have issues with epigastric ab pain. Will plan for comprehensive work up to determine the source of her symptoms. Her epigastric ab pain and melena could be due to PUD, esophagitis, gastritis/duodenitis. Rectal bleeding could be due to hemorrhoids. Would want to rule out concerning etiologies of bleeding such as IBD or malignancy. Has history of significant alcohol use and since no recent LFTs are available, will also look for gallbladder, liver, and pancreas etiologies of her pain. Will plan for infectious work up of her diarrhea. - Continue PPI BID - Check LFTs, lipase, TTG IgA, IgA. Labs show mildly elevated AST and ALT of 69 and 64, respectively. Mildly elevated lipase is not clinically significant for pancreatitis. TTG IgA and IgA were negative for celiac. - Check GI pathogen, O&P, pancreas elastase - Check CT A/P w/contrast - EGD/colonoscopy [facility] - RTC 2 months  Christia Reading, MD  I spent 62 minutes of time, including in depth chart review, independent review of results as outlined above,  communicating results with the patient directly, face-to-face time with the patient, coordinating care, ordering studies and medications as appropriate, and documentation.

## 2021-09-23 NOTE — Patient Instructions (Signed)
You have been scheduled for a CT scan of the abdomen and pelvis at [facility] Radiology (1st floor of hospital).   You are scheduled on 09/30/22 at 5:00 pm. You should arrive 15 minutes prior to your appointment time for registration. Please follow the written instructions below on the day of your exam:  WARNING: IF YOU ARE ALLERGIC TO IODINE/X-RAY DYE, PLEASE NOTIFY RADIOLOGY IMMEDIATELY AT [phone]! YOU WILL BE GIVEN A 13 HOUR PREMEDICATION PREP.  1) Do not eat or drink anything after 1:00 pm pm (4 hours prior to your test) 2) You have been given 2 bottles of oral contrast to drink. The solution may taste better if refrigerated, but do NOT add ice or any other liquid to this solution. Shake well before drinking.    Drink 1 bottle of contrast @ 3:00 pm (2 hours prior to your exam)  Drink 1 bottle of contrast @ 4:00 pm (1 hour prior to your exam)  You may take any medications as prescribed with a small amount of water, if necessary. If you take any of the following medications: METFORMIN, GLUCOPHAGE, GLUCOVANCE, AVANDAMET, RIOMET, FORTAMET, [place] MET, JANUMET, GLUMETZA or METAGLIP, you MAY be asked to HOLD this medication 48 hours AFTER the exam.  The purpose of you drinking the oral contrast is to aid in the visualization of your intestinal tract. The contrast solution may cause some diarrhea. Depending on your individual set of symptoms, you may also receive an intravenous injection of x-ray contrast/dye. Plan on being at [facility] for 30 minutes or longer, depending on the type of exam you are having performed.  This test typically takes 30-45 minutes to complete.  If you have any questions regarding your exam or if you need to reschedule, you may call the CT department at [phone] between the hours of 8:00 am and 5:00 pm, Monday-Friday.  ________________________________________________________  Meghan Riggs have been scheduled for an endoscopy and colonoscopy. Please follow the written  instructions given to you at your visit today. Please pick up your prep supplies at the pharmacy within the next 1-3 days. If you use inhalers (even only as needed), please bring them with you on the day of your procedure. ________________________________________________________  Your provider has requested that you go to the basement level for lab work before leaving today. Press "B" on the elevator. The lab is located at the first door on the left as you exit the elevator. ________________________________________________________  Please follow up with Dr Lorenso Courier on 11/25/21 at 950 am.  ________________________________________________________  If you are age 26 or older, your body mass index should be between 23-30. Your Body mass index is 47.9 kg/m. If this is out of the aforementioned range listed, please consider follow up with your Primary Care Provider.  If you are age 26 or younger, your body mass index should be between 19-25. Your Body mass index is 47.9 kg/m. If this is out of the aformentioned range listed, please consider follow up with your Primary Care Provider.   ________________________________________________________  The [facility] providers would like to encourage you to use [facility] to communicate with providers for non-urgent requests or questions.  Due to long hold times on the telephone, sending your provider a message by [facility] may be a faster and more efficient way to get a response.  Please allow 48 business hours for a response.  Please remember that this is for non-urgent requests.  _______________________________________________________  Due to recent changes in healthcare laws, you may see the results  of your imaging and laboratory studies on MyChart before your provider has had a chance to review them.  We understand that in some cases there may be results that are confusing or concerning to you. Not all laboratory results come back in the same time frame and the  provider may be waiting for multiple results in order to interpret others.  Please give [name] 48 hours in order for your provider to thoroughly review all the results before contacting the office for clarification of your results.

## 2021-09-24 LAB — HEPATIC FUNCTION PANEL
ALT: 64 U/L — ABNORMAL HIGH (ref 0–35)
AST: 69 U/L — ABNORMAL HIGH (ref 0–37)
Albumin: 4.1 g/dL (ref 3.5–5.2)
Alkaline Phosphatase: 76 U/L (ref 39–117)
Bilirubin, Direct: 0.1 mg/dL (ref 0.0–0.3)
Total Bilirubin: 0.4 mg/dL (ref 0.2–1.2)
Total Protein: 7.4 g/dL (ref 6.0–8.3)

## 2021-09-24 LAB — IGA: Immunoglobulin A: 297 mg/dL (ref 47–310)

## 2021-09-24 LAB — LIPASE: Lipase: 64 U/L — ABNORMAL HIGH (ref 11.0–59.0)

## 2021-09-24 LAB — TISSUE TRANSGLUTAMINASE, IGA: (tTG) Ab, IgA: <1 U/mL

## 2021-09-29 ENCOUNTER — Other Ambulatory Visit: Payer: Medicaid Other

## 2021-09-29 DIAGNOSIS — R1013 Epigastric pain: Secondary | ICD-10-CM

## 2021-09-29 DIAGNOSIS — K921 Melena: Secondary | ICD-10-CM

## 2021-09-29 DIAGNOSIS — K625 Hemorrhage of anus and rectum: Secondary | ICD-10-CM

## 2021-09-29 DIAGNOSIS — R197 Diarrhea, unspecified: Secondary | ICD-10-CM

## 2021-09-30 ENCOUNTER — Ambulatory Visit (HOSPITAL_COMMUNITY)
Admission: RE | Admit: 2021-09-30 | Discharge: 2021-09-30 | Disposition: A | Discharge status: Home / Self Care | Payer: Medicaid Other | Source: Ambulatory Visit | Attending: Internal Medicine | Admitting: Internal Medicine

## 2021-09-30 ENCOUNTER — Other Ambulatory Visit: Payer: Self-pay

## 2021-09-30 DIAGNOSIS — R197 Diarrhea, unspecified: Secondary | ICD-10-CM | POA: Diagnosis present

## 2021-09-30 DIAGNOSIS — R1013 Epigastric pain: Secondary | ICD-10-CM | POA: Diagnosis present

## 2021-09-30 DIAGNOSIS — K625 Hemorrhage of anus and rectum: Secondary | ICD-10-CM | POA: Insufficient documentation

## 2021-09-30 DIAGNOSIS — K921 Melena: Secondary | ICD-10-CM | POA: Insufficient documentation

## 2021-09-30 MED ORDER — IOHEXOL 350 MG/ML SOLN
80.0000 mL | Freq: Once | INTRAVENOUS | Status: AC | PRN
  Administered 2021-09-30: 18:00:00 80 mL via INTRAVENOUS

## 2021-10-01 ENCOUNTER — Encounter: Payer: Self-pay | Admitting: Internal Medicine

## 2021-10-03 LAB — GI PROFILE, STOOL, PCR

## 2021-10-06 LAB — OVA AND PARASITE EXAMINATION
CONCENTRATE RESULT:: NONE SEEN
MICRO NUMBER:: 12785745
SPECIMEN QUALITY:: ADEQUATE
TRICHROME RESULT:: NONE SEEN

## 2021-10-06 LAB — PANCREATIC ELASTASE, FECAL: Pancreatic Elastase-1, Stool: >500 mcg/g

## 2021-10-07 ENCOUNTER — Encounter: Payer: Medicaid Other | Admitting: Internal Medicine

## 2021-10-13 ENCOUNTER — Other Ambulatory Visit: Payer: Self-pay

## 2021-10-13 ENCOUNTER — Ambulatory Visit (INDEPENDENT_AMBULATORY_CARE_PROVIDER_SITE_OTHER): Payer: Medicaid Other | Admitting: Internal Medicine

## 2021-10-13 ENCOUNTER — Encounter: Payer: Self-pay | Admitting: Internal Medicine

## 2021-10-13 DIAGNOSIS — K2950 Unspecified chronic gastritis without bleeding: Secondary | ICD-10-CM

## 2021-10-13 DIAGNOSIS — K649 Unspecified hemorrhoids: Secondary | ICD-10-CM

## 2021-10-13 NOTE — Progress Notes (Signed)
° °  CC: 4 week follow up last seen for hemorrhoids  HPI:[name] is a 26 y.o. female who presents for evaluation of hemorrhoids. Please see individual problem based A/P for details.   Depression, PHQ-9: Based on the patients  [place] Visit from 09/08/2021 in [place]  PHQ-9 Total Score 2      score we have 2.  Past Medical History:  Diagnosis Date   Anemia    Anxiety    Arthritis    Asthma    Dr. Donneta Riggs   Benign liver cyst    Club foot 1996   Congenital   Depression    DM (diabetes mellitus) ([place])    Gastritis    HTN (hypertension)    Hyperlipidemia    Internal hemorrhoids    Osteoarthritis    Review of Systems:   Review of Systems  Constitutional:  Negative for chills, fever, malaise/fatigue and weight loss.  HENT: Negative.    Eyes: Negative.   Cardiovascular: Negative.   Gastrointestinal:  Positive for abdominal pain, blood in stool, diarrhea and heartburn. Negative for melena.  Genitourinary: Negative.   Skin: Negative.   Neurological: Negative.   Endo/Heme/Allergies: Negative.     Physical Exam: There were no vitals filed for this visit.   General: alert and oriented, no acute distress, obese HEENT: Conjunctiva nl , antiicteric sclerae, moist mucous membranes, no exudate or erythema Cardiovascular: Normal rate, regular rhythm.  No murmurs, rubs, or gallops Pulmonary : Equal breath sounds, No wheezes, rales, or rhonchi Abdominal: soft, mild tenderness in left lower quadrant and suprapubic region,  bowel sounds present, not distended Ext: No edema in lower extremities, no tenderness to palpation of lower extremities.   Assessment & Plan:   See Encounters Tab for problem based charting.  Patient discussed with Dr. Jimmye Riggs

## 2021-10-13 NOTE — Patient Instructions (Addendum)
Dear Mrs. Ferch,  Thank you for trusting [name] with your care today.  We discussed your hemorrhoids. Please purchase some preparation H suppositories to help with the internal hemorrhoids. Sitz baths can also help with symptomatic relief for the external hemorrhoids. Please continue to follow with your GI doctor. We will plan to see you back in the clinic in about 6 months.

## 2021-10-14 ENCOUNTER — Encounter: Payer: Self-pay | Admitting: Internal Medicine

## 2021-10-14 NOTE — Assessment & Plan Note (Addendum)
Patient has continued to have symptoms. Protonix has been helping slightly. She has not yet filled Carafate Rx but is planning to do so later this week.   CT showed some diverticuli in sigmoid colon.   She is scheduled for EGD and colonoscopy January 27th with GI.

## 2021-10-14 NOTE — Assessment & Plan Note (Signed)
Patient has been using OTC creams for hemorrhoidal symptom relief with some improvement. Still present and still noticing blood with stools. Hemorrhoids have shown little improvement and are still present. She reports symptom exacerbation with stools.   Recommended Sitz baths with patient. She will continue with hemorrhoid creams as well.

## 2021-11-05 ENCOUNTER — Encounter: Payer: Self-pay | Admitting: Internal Medicine

## 2021-11-05 ENCOUNTER — Ambulatory Visit (AMBULATORY_SURGERY_CENTER): Payer: Medicaid Other | Admitting: Internal Medicine

## 2021-11-05 VITALS — BP 112/79 | HR 72 | Temp 97.3°F | Resp 16 | Ht 60.0 in | Wt 245.0 lb

## 2021-11-05 DIAGNOSIS — K259 Gastric ulcer, unspecified as acute or chronic, without hemorrhage or perforation: Secondary | ICD-10-CM

## 2021-11-05 DIAGNOSIS — K2970 Gastritis, unspecified, without bleeding: Secondary | ICD-10-CM | POA: Diagnosis not present

## 2021-11-05 DIAGNOSIS — K625 Hemorrhage of anus and rectum: Secondary | ICD-10-CM

## 2021-11-05 DIAGNOSIS — K2951 Unspecified chronic gastritis with bleeding: Secondary | ICD-10-CM | POA: Diagnosis not present

## 2021-11-05 DIAGNOSIS — R197 Diarrhea, unspecified: Secondary | ICD-10-CM

## 2021-11-05 DIAGNOSIS — K200 Eosinophilic esophagitis: Secondary | ICD-10-CM | POA: Diagnosis not present

## 2021-11-05 DIAGNOSIS — K648 Other hemorrhoids: Secondary | ICD-10-CM

## 2021-11-05 DIAGNOSIS — K920 Hematemesis: Secondary | ICD-10-CM

## 2021-11-05 DIAGNOSIS — R1013 Epigastric pain: Secondary | ICD-10-CM

## 2021-11-05 DIAGNOSIS — K921 Melena: Secondary | ICD-10-CM

## 2021-11-05 DIAGNOSIS — K649 Unspecified hemorrhoids: Secondary | ICD-10-CM

## 2021-11-05 DIAGNOSIS — K2289 Other specified disease of esophagus: Secondary | ICD-10-CM | POA: Diagnosis not present

## 2021-11-05 DIAGNOSIS — K529 Noninfective gastroenteritis and colitis, unspecified: Secondary | ICD-10-CM | POA: Diagnosis not present

## 2021-11-05 MED ORDER — SUCRALFATE 1 GM/10ML PO SUSP: 1.0000 g | Freq: Four times a day (QID) | ORAL | 1 refills | Status: AC

## 2021-11-05 MED ORDER — SODIUM CHLORIDE 0.9 % IV SOLN: 500.0000 mL | Freq: Once | INTRAVENOUS | Status: DC

## 2021-11-05 MED ORDER — HYDROCORTISONE (PERIANAL) 2.5 % EX CREA: 1.0000 "application " | TOPICAL_CREAM | Freq: Two times a day (BID) | CUTANEOUS | 1 refills | Status: AC

## 2021-11-05 MED ORDER — OMEPRAZOLE 40 MG PO CPDR: 40.0000 mg | DELAYED_RELEASE_CAPSULE | Freq: Two times a day (BID) | ORAL | 0 refills | Status: DC

## 2021-11-05 NOTE — Patient Instructions (Addendum)
Await pathology results.  Handouts given for gastritis and hemorrhoids.    YOU HAD AN ENDOSCOPIC PROCEDURE TODAY AT [facility]:   Refer to the procedure report that was given to you for any specific questions about what was found during the examination.  If the procedure report does not answer your questions, please call your gastroenterologist to clarify.  If you requested that your care partner not be given the details of your procedure findings, then the procedure report has been included in a sealed envelope for you to review at your convenience later.  YOU SHOULD EXPECT: Some feelings of bloating in the abdomen. Passage of more gas than usual.  Walking can help get rid of the air that was put into your GI tract during the procedure and reduce the bloating. If you had a lower endoscopy (such as a colonoscopy or flexible sigmoidoscopy) you may notice spotting of blood in your stool or on the toilet paper. If you underwent a bowel prep for your procedure, you may not have a normal bowel movement for a few days.  Please Note:  You might notice some irritation and congestion in your nose or some drainage.  This is from the oxygen used during your procedure.  There is no need for concern and it should clear up in a day or so.  SYMPTOMS TO REPORT IMMEDIATELY:  Following lower endoscopy (colonoscopy or flexible sigmoidoscopy):  Excessive amounts of blood in the stool  Significant tenderness or worsening of abdominal pains  Swelling of the abdomen that is new, acute  Fever of 100F or higher  Following upper endoscopy (EGD)  Vomiting of blood or coffee ground material  New chest pain or pain under the shoulder blades  Painful or persistently difficult swallowing  New shortness of breath  Fever of 100F or higher  Black, tarry-looking stools  For urgent or emergent issues, a gastroenterologist can be reached at any hour by calling [phone]. Do not use MyChart messaging for  urgent concerns.    DIET:  We do recommend a small meal at first, but then you may proceed to your regular diet.  Drink plenty of fluids but you should avoid alcoholic beverages for 24 hours.  ACTIVITY:  You should plan to take it easy for the rest of today and you should NOT DRIVE or use heavy machinery until tomorrow (because of the sedation medicines used during the test).    FOLLOW UP: Our staff will call the number listed on your records 48-72 hours following your procedure to check on you and address any questions or concerns that you may have regarding the information given to you following your procedure. If we do not reach you, we will leave a message.  We will attempt to reach you two times.  During this call, we will ask if you have developed any symptoms of COVID 19. If you develop any symptoms (ie: fever, flu-like symptoms, shortness of breath, cough etc.) before then, please call [phone].  If you test positive for Covid 19 in the 2 weeks post procedure, please call and report this information to [name].    If any biopsies were taken you will be contacted by phone or by letter within the next 1-3 weeks.  Please call us at [phone] if you have not heard about the biopsies in 3 weeks.    SIGNATURES/CONFIDENTIALITY: You and/or your care partner have signed paperwork which will be entered into your electronic medical record.  These  signatures attest to the fact that that the information above on your After Visit Summary has been reviewed and is understood.  Full responsibility of the confidentiality of this discharge information lies with you and/or your care-partner.

## 2021-11-05 NOTE — Op Note (Signed)
[place] Patient Name: Meghan Riggs Procedure Date: 11/05/2021 2:03 PM MRN: [id number] Endoscopist: Sonny Masters "Meghan Riggs ,  Age: 26 Referring MD:  Date of Birth: [birth year] Gender: Female Account #: [id number] Procedure:                Colonoscopy Indications:              Clinically significant diarrhea of unexplained                            origin, Hematochezia Medicines:                Monitored Anesthesia Care Procedure:                Pre-Anesthesia Assessment:                           - Prior to the procedure, a History and Physical                            was performed, and patient medications and                            allergies were reviewed. The patient's tolerance of                            previous anesthesia was also reviewed. The risks                            and benefits of the procedure and the sedation                            options and risks were discussed with the patient.                            All questions were answered, and informed consent                            was obtained. Prior Anticoagulants: The patient has                            taken no previous anticoagulant or antiplatelet                            agents. ASA Grade Assessment: III - A patient with                            severe systemic disease. After reviewing the risks                            and benefits, the patient was deemed in                            satisfactory condition to undergo the procedure.  After obtaining informed consent, the colonoscope                            was passed under direct vision. Throughout the                            procedure, the patient's blood pressure, pulse, and                            oxygen saturations were monitored continuously. The                            Colonoscope was introduced through the anus and                            advanced to the the terminal  ileum. The colonoscopy                            was performed without difficulty. The patient                            tolerated the procedure well. The quality of the                            bowel preparation was excellent. The terminal                            ileum, ileocecal valve, appendiceal orifice, and                            rectum were photographed. Scope In: 2:15:42 PM Scope Out: 2:24:10 PM Scope Withdrawal Time: 0 hours 6 minutes 27 seconds  Total Procedure Duration: 0 hours 8 minutes 28 seconds  Findings:                 The terminal ileum appeared normal.                           Biopsies were taken with a cold forceps in the                            rectum, in the sigmoid colon, in the descending                            colon, in the transverse colon, in the ascending                            colon and in the cecum for histology.                           Non-bleeding internal hemorrhoids were found during                            retroflexion. Complications:  No immediate complications. Estimated Blood Loss:     Estimated blood loss was minimal. Impression:               - The examined portion of the ileum was normal.                           - Non-bleeding internal hemorrhoids.                           - Biopsies were taken with a cold forceps for                            histology in the rectum, in the sigmoid colon, in                            the descending colon, in the transverse colon, in                            the ascending colon and in the cecum. Recommendation:           - Discharge patient to home (with escort).                           - It is suspected that your abdominal pain is due                            to the inflammation found in the stomach. It is                            suspected that your rectal bleeding is due to                            hemorrhoids.                           - Await pathology  results.                           - The findings and recommendations were discussed                            with the patient. Sonny Masters "Meghan Riggs,  11/05/2021 2:31:36 PM

## 2021-11-05 NOTE — Progress Notes (Signed)
GASTROENTEROLOGY PROCEDURE H&P NOTE   Primary Care Physician: Delene Ruffini, MD    Reason for Procedure:   Epigastric ab pain, diarrhea, melena, hematochezia  Plan:    EGD/colonoscopy  Patient is appropriate for endoscopic procedure(s) in the ambulatory ([facility]) setting.  The nature of the procedure, as well as the risks, benefits, and alternatives were carefully and thoroughly reviewed with the patient. Ample time for discussion and questions allowed. The patient understood, was satisfied, and agreed to proceed.     HPI: Meghan Riggs is a 26 y.o. female who presents for EGD/colonoscopy for epigastric ab pain, diarrhea, melena, hematochezia. She was last seen in GI clinic on 09/23/21.  Past Medical History:  Diagnosis Date   Anemia    Anxiety    Arthritis    Asthma    Dr. Donneta Romberg   Benign liver cyst    Club foot 1996   Congenital   Depression    DM (diabetes mellitus) ([place])    Gastritis    HTN (hypertension)    Hyperlipidemia    Internal hemorrhoids    Osteoarthritis     Past Surgical History:  Procedure Laterality Date   AXILLARY LYMPH NODE DISSECTION Left 2006   CESAREAN SECTION  07/2020   CLUB FOOT RELEASE Bilateral 6864   @ 54 days old   KNEE SURGERY Left 2010   LAPAROSCOPIC APPENDECTOMY N/A 06/04/2017   Procedure: APPENDECTOMY LAPAROSCOPIC;  Surgeon: Erroll Luna, MD;  Location: [facility];  Service: General;  Laterality: N/A;    Prior to Admission medications   Medication Sig Start Date End Date Taking? Authorizing Provider  albuterol (PROVENTIL HFA;VENTOLIN HFA) 108 (90 Base) MCG/ACT inhaler Inhale 1-2 puffs into the lungs every 6 (six) hours as needed for wheezing or shortness of breath. 09/10/18   Marcelyn Bruins, MD  FLUoxetine (PROZAC) 40 MG capsule Take 40 mg by mouth daily. 08/16/21   [provider]  hydrOXYzine (ATARAX) 10 MG tablet Take 10 mg by mouth 3 (three) times daily. 05/25/21   [provider]  hydrOXYzine  (ATARAX) 50 MG tablet Take 50 mg by mouth at bedtime.    [provider]  pantoprazole (PROTONIX) 40 MG tablet Take 1 tablet (40 mg total) by mouth 2 (two) times daily. 09/08/21   Jose Persia, MD  Sodium Sulfate-Mag Sulfate-KCl (SUTAB) [phone] MG TABS Use as directed for colonoscopy. MANUFACTURER CODES!! BIN: [id number] PCN: CN GROUP: [id number] MEMBER ID: [id number];FFM AS SECONDARY INSURANCE ;NO PRIOR AUTHORIZATION 09/23/21   Sharyn Creamer, MD  sucralfate (CARAFATE) 1 g tablet Take 1 tablet (1 g total) by mouth 4 (four) times daily -  with meals and at bedtime. Patient not taking: Reported on 09/23/2021 09/13/21 09/13/22  Jose Persia, MD    Current Outpatient Medications  Medication Sig Dispense Refill   albuterol (PROVENTIL HFA;VENTOLIN HFA) 108 (90 Base) MCG/ACT inhaler Inhale 1-2 puffs into the lungs every 6 (six) hours as needed for wheezing or shortness of breath. 1 Inhaler 3   FLUoxetine (PROZAC) 40 MG capsule Take 40 mg by mouth daily.     hydrOXYzine (ATARAX) 10 MG tablet Take 10 mg by mouth 3 (three) times daily.     hydrOXYzine (ATARAX) 50 MG tablet Take 50 mg by mouth at bedtime.     pantoprazole (PROTONIX) 40 MG tablet Take 1 tablet (40 mg total) by mouth 2 (two) times daily. 60 tablet 2   Sodium Sulfate-Mag Sulfate-KCl (SUTAB) [phone] MG TABS Use as directed for colonoscopy. MANUFACTURER CODES!! BIN:  [id number] PCN: CN GROUP: [id number] MEMBER ID: [id number];JJO AS SECONDARY INSURANCE ;NO PRIOR AUTHORIZATION 24 tablet 0   sucralfate (CARAFATE) 1 g tablet Take 1 tablet (1 g total) by mouth 4 (four) times daily -  with meals and at bedtime. (Patient not taking: Reported on 09/23/2021) 120 tablet 0   Current Facility-Administered Medications  Medication Dose Route Frequency Provider Last Rate Last Admin   0.9 %  sodium chloride infusion  500 mL Intravenous Once Sharyn Creamer, MD        Allergies as of 11/05/2021   (No Known Allergies)    Family History   Problem Relation Age of Onset   COPD Mother    Asthma Mother    Cervical cancer Mother    Interstitial cystitis Mother    Berenice Primas' disease Mother    Diabetes Father    Brain cancer Father    Heart failure Father    Club foot Brother    COPD Maternal Grandmother    Asthma Maternal Grandmother    Cirrhosis Maternal Grandfather    Diabetes Paternal Grandmother    Breast cancer Paternal Grandmother    Heart disease Paternal Grandfather    Diabetes Paternal Uncle    Colon cancer Neg Hx    Colon polyps Neg Hx     Social History   Socioeconomic History   Marital status: Significant Other    Spouse name: Not on file   Number of children: 1   Years of education: Not on file   Highest education level: Not on file  Occupational History   Occupation: SAHM  Tobacco Use   Smoking status: Light Smoker    Types: Cigarettes   Smokeless tobacco: Never  Vaping Use   Vaping Use: Never used  Substance and Sexual Activity   Alcohol use: Yes    Comment: seldom   Drug use: No    Comment: hx marijuana use per [facility] record 01/29/20   Sexual activity: Yes    Birth control/protection: Inserts, None  Other Topics Concern   Not on file  Social History Narrative   Not on file   Social Determinants of Health   Financial Resource Strain: Not on file  Food Insecurity: Not on file  Transportation Needs: Not on file  Physical Activity: Not on file  Stress: Not on file  Social Connections: Not on file  Intimate Partner Violence: Not on file    Physical Exam: Vital signs in last 24 hours: BP 118/60    Pulse 74    Temp (!) 97.3 F (36.3 C) (Temporal)    Ht 5' (1.524 m)    Wt 245 lb (111.1 kg)    LMP 10/25/2021 (Exact Date)    SpO2 98%    BMI 47.85 kg/m  GEN: NAD EYE: Sclerae anicteric ENT: MMM CV: Non-tachycardic Pulm: No increased work of breathing GI: Soft, NT/ND NEURO:  Alert & Oriented   Christia Reading, MD [facility]  11/05/2021 1:28 PM

## 2021-11-05 NOTE — Progress Notes (Signed)
Pt's states no medical or surgical changes since previsit or office visit.  [place] 

## 2021-11-05 NOTE — Op Note (Addendum)
Advance Patient Name: Meghan Riggs Procedure Date: 11/05/2021 2:03 PM MRN: [id number] Endoscopist: Sonny Masters "Meghan Riggs ,  Age: 26 Referring MD:  Date of Birth: 1996-06-30 Gender: Female Account #: [id number] Procedure:                Upper GI endoscopy Indications:              Epigastric abdominal pain, Melena Medicines:                Monitored Anesthesia Care Procedure:                Pre-Anesthesia Assessment:                           - Prior to the procedure, a History and Physical                            was performed, and patient medications and                            allergies were reviewed. The patient's tolerance of                            previous anesthesia was also reviewed. The risks                            and benefits of the procedure and the sedation                            options and risks were discussed with the patient.                            All questions were answered, and informed consent                            was obtained. Prior Anticoagulants: The patient has                            taken no previous anticoagulant or antiplatelet                            agents. ASA Grade Assessment: III - A patient with                            severe systemic disease. After reviewing the risks                            and benefits, the patient was deemed in                            satisfactory condition to undergo the procedure.                           After obtaining informed consent, the endoscope was  passed under direct vision. Throughout the                            procedure, the patient's blood pressure, pulse, and                            oxygen saturations were monitored continuously. The                            GIF [id number] #[id number] was introduced through the                            mouth, and advanced to the second part of duodenum.                            The upper GI  endoscopy was accomplished without                            difficulty. The patient tolerated the procedure                            well. Scope In: Scope Out: Findings:                 The examined esophagus was normal. Biopsies were                            taken with a cold forceps for histology.                           Localized severe inflammation characterized by                            congestion (edema), erythema and shallow                            ulcerations was found in the gastric body. Biopsies                            were taken with a cold forceps for histology.                           The examined duodenum was normal. Biopsies were                            taken with a cold forceps for histology. Complications:            No immediate complications. Estimated Blood Loss:     Estimated blood loss was minimal. Impression:               - Normal esophagus. Biopsied.                           - Gastritis. Biopsied.                           -  Normal examined duodenum. Biopsied. Recommendation:           - Use Prilosec (omeprazole) 40 mg PO BID for 8                            weeks. (switch from pantoprazole)                           - Start sucralfate suspension QID for 2 weeks.                           - Repeat upper endoscopy in 2 months to check                            healing.                           - Await pathology results.                           - Perform a colonoscopy today. [address]Christia Riggs,  11/05/2021 2:27:38 PM

## 2021-11-05 NOTE — Progress Notes (Signed)
A and O x3. Report to RN. Tolerated MAC anesthesia well. Teeth unchanged after procedure.

## 2021-11-05 NOTE — Progress Notes (Signed)
Called to room to assist during endoscopic procedure.  Patient ID and intended procedure confirmed with present staff. Received instructions for my participation in the procedure from the performing physician.  

## 2021-11-08 ENCOUNTER — Other Ambulatory Visit: Payer: Self-pay

## 2021-11-08 DIAGNOSIS — K2961 Other gastritis with bleeding: Secondary | ICD-10-CM

## 2021-11-09 ENCOUNTER — Telehealth: Payer: Self-pay

## 2021-11-09 NOTE — Telephone Encounter (Signed)
°  Follow up Call-  Call back number 11/05/2021  Post procedure Call Back phone  # [phone]  Permission to leave phone message Yes  Some recent data might be hidden     Patient questions:  Do you have a fever, pain , or abdominal swelling? No. Pain Score  0 *  Have you tolerated food without any problems? Yes.    Have you been able to return to your normal activities? Yes.    Do you have any questions about your discharge instructions: Diet   No. Medications  No. Follow up visit  No.  Do you have questions or concerns about your Care? No.  Actions: * If pain score is 4 or above: No action needed, pain <4.  Have you developed a fever since your procedure? no  2.   Have you had an respiratory symptoms (SOB or cough) since your procedure? no  3.   Have you tested positive for COVID 19 since your procedure no  4.   Have you had any family members/close contacts diagnosed with the COVID 19 since your procedure?  no   If yes to any of these questions please route to Joylene John, RN and Joella Prince, RN

## 2021-11-17 ENCOUNTER — Encounter: Payer: Self-pay | Admitting: Internal Medicine

## 2021-11-19 ENCOUNTER — Encounter: Payer: Self-pay | Admitting: Internal Medicine

## 2021-11-22 ENCOUNTER — Ambulatory Visit (INDEPENDENT_AMBULATORY_CARE_PROVIDER_SITE_OTHER): Payer: Medicaid Other | Admitting: Student

## 2021-11-22 DIAGNOSIS — J069 Acute upper respiratory infection, unspecified: Secondary | ICD-10-CM | POA: Insufficient documentation

## 2021-11-22 HISTORY — DX: Acute upper respiratory infection, unspecified: J06.9

## 2021-11-22 NOTE — Assessment & Plan Note (Addendum)
Meghan Riggs states that she began feeling unwell on Saturday.  States that she initially had body aches and attributed it to cold weather.  However, yesterday she began to feel worse and developed a dry cough, scratchy throat, and began feeling weak.  She also feels like her appetite is less, but she was able to eat pizza today.  She checked her temperature last night and noted it to be 99.66F.  Also states that she had 2 episodes of loose stools this morning.  Denies any runny nose, congestion, chills, N/V. States that her [name] was sick earlier this week but she felt fine at that time.  Also states that her birthday was yesterday and she did meet with some friends on Friday but did not note any of them to be sick.  Discussed that her symptoms are consistent with a viral illness.  She did receive 1 COVID vaccination in 2021.  States that she received her flu shot this year although chart review lists that she is due for it still.  Given history of asthma and onset of symptoms 2 days ago, advised her to take an at home COVID test to see if she would be a candidate for Paxlovid.  She is likely out of the window for benefit with Tamiflu at this time, so likely would not benefit from influenza testing.  Otherwise, recommended symptomatic management with over-the-counter medications (Mucinex, Tylenol).  Discussed that should her symptoms persist or worsen over the next couple of weeks, then to notify [facility].  Plan: -at-home COVID test, if positive consider paxlovid therapy -symptomatic management with OTC medications

## 2021-11-22 NOTE — Progress Notes (Signed)
°  [facility] Internal Medicine Residency Telephone Encounter Continuity Care Appointment  HPI:  This telephone encounter was created for Ms. [place] on 11/22/2021 for the following purpose/cc: not feeling well. Ms. Barretto states that she began feeling unwell on Saturday.  States that she initially had body aches and attributed it to cold weather.  However, yesterday she began to feel worse and developed a dry cough, scratchy throat, and began feeling weak.  She also feels like her appetite is less, but she was able to eat pizza today.  She checked her temperature last night and noted it to be 99.35F.  Also states that she had 2 episodes of loose stools this morning.  Denies any runny nose, congestion, chills, N/V. States that her [name] was sick earlier this week but she felt fine at that time.  Also states that her birthday was yesterday and she did meet with some friends on Friday but did not note any of them to be sick. Discussed that her symptoms are consistent with a viral illness.  She did receive 1 COVID vaccination in 2021.  States that she received her flu shot this year although chart review lists that she is due for it still.  Given history of asthma and onset of symptoms 2 days ago, advised her to take an at home COVID test to see if she would be a candidate for Paxlovid.  She is likely out of the window for benefit with Tamiflu at this time, so likely would not benefit from influenza testing. Otherwise, recommended symptomatic management with over-the-counter medications (Mucinex, Tylenol).  Discussed that should her symptoms persist or worsen over the next couple of weeks, then to notify [facility].   Past Medical History:  Past Medical History:  Diagnosis Date   Anemia    Anxiety    Arthritis    Asthma    Dr. Donneta Romberg   Benign liver cyst    Club foot 1996   Congenital   Depression    DM (diabetes mellitus) ([place])    Gastritis    HTN (hypertension)    Hyperlipidemia     Internal hemorrhoids    Osteoarthritis      ROS:  Negative aside from that listed in HPI.   Assessment / Plan / Recommendations:  Please see A&P under problem oriented charting for assessment of the patient's acute and chronic medical conditions.  As always, pt is advised that if symptoms worsen or new symptoms arise, they should go to an urgent care facility or to to ER for further evaluation.   Consent and Medical Decision Making:  Patient discussed with Dr.  Saverio Danker This is a telephone encounter between [facility] and [place] on 11/22/2021 for likely viral illness. The visit was conducted with the patient located at home and [place] at [facility]. The patient's identity was confirmed using their DOB and current address. The patient has consented to being evaluated through a telephone encounter and understands the associated risks (an examination cannot be done and the patient may need to come in for an appointment) / benefits (allows the patient to remain at home, decreasing exposure to coronavirus). I personally spent 20 minutes on medical discussion.

## 2021-11-23 NOTE — Progress Notes (Signed)
Internal Medicine Clinic Attending  Case discussed with Dr. Jinwala  At the time of the visit.  We reviewed the resident's history and exam and pertinent patient test results.  I agree with the assessment, diagnosis, and plan of care documented in the resident's note.  

## 2021-11-23 NOTE — Addendum Note (Signed)
Addended by: Charise Killian on: 11/23/2021 10:23 AM   Modules accepted: Level of Service

## 2021-11-24 ENCOUNTER — Telehealth: Payer: Self-pay | Admitting: *Deleted

## 2021-11-24 NOTE — Telephone Encounter (Signed)
Return from pt - informed of Dr Clinton Sawyer response. Stated she has done everything that was asked of her but continues to have the right sided neck pain.And has taken different medications as mentioned in prior call. I informed her if not feeling better to go to UC; stated she will go after her husband gets off of work.

## 2021-11-24 NOTE — Telephone Encounter (Signed)
Called pt - no answer, left message to call the office on self-identified vm.

## 2021-11-24 NOTE — Telephone Encounter (Signed)
Patient called in stating she is no better since tele OV on 2/13. States she has felt "crappy" since 2/11. "So much pressure on the right side of my face, neck, and head." Denies nasal drainage or sore throat but states, "It is hard to swallow." Also c/o dry cough. Has had temp as high as 99.8 and "a lot of sweating." States Covid test was negative. She has used tylenol, ibuprofen, Mucinex, Flonase w/o relief. She is advised she may need to be seen in UC as there are no openings in [facility] this week. She asked that a message be sent to Provider she spoke with on 2/13 for any other recommendations.

## 2021-11-25 ENCOUNTER — Ambulatory Visit: Payer: Medicaid Other | Admitting: Internal Medicine

## 2021-12-02 ENCOUNTER — Other Ambulatory Visit: Payer: Self-pay

## 2021-12-02 ENCOUNTER — Ambulatory Visit (INDEPENDENT_AMBULATORY_CARE_PROVIDER_SITE_OTHER): Payer: Medicaid Other | Admitting: Student

## 2021-12-02 ENCOUNTER — Encounter: Payer: Self-pay | Admitting: Student

## 2021-12-02 DIAGNOSIS — H6690 Otitis media, unspecified, unspecified ear: Secondary | ICD-10-CM

## 2021-12-02 MED ORDER — AMOXICILLIN-POT CLAVULANATE 875-125 MG PO TABS: 1.0000 | ORAL_TABLET | Freq: Two times a day (BID) | ORAL | 0 refills | Status: AC

## 2021-12-02 NOTE — Progress Notes (Signed)
° °  CC: Ear fullness  HPI:  [name] is a 26 y.o. female with PMH as below who presents to clinic for evaluation of ear fullness/clogged ears. Please see problem based charting for evaluation, assessment and plan.  Past Medical History:  Diagnosis Date   Anemia    Anxiety    Arthritis    Asthma    Dr. Donneta Romberg   Benign liver cyst    Club foot 1996   Congenital   Depression    DM (diabetes mellitus) ([place])    Gastritis    HTN (hypertension)    Hyperlipidemia    Internal hemorrhoids    Osteoarthritis     Review of Systems:  Constitutional: Negative for fever or chills. HEENT: Negative for sinus pain, visual changes, ear drainage or runny nose. Positive for ear fullness  Respiratory: Negative for shortness of breath or cough Abdomen: Negative for abdominal pain, constipation or diarrhea Neuro: Negative for headache, dizziness or weakness  Physical Exam: General: Pleasant, well-appearing young Caucasian female.  No acute distress. HEENT: MMM. No sinus tenderness. Ears: No significant earwax in both ears. Mild erythema of the canal. No drainage or effusion. Right ear with mild bulging of the tympanic membrane. No pinna tenderness. Cardiac: RRR. No murmurs, rubs or gallops.  Respiratory: Lungs CTAB. No wheezing or crackles. Skin: Warm, dry and intact without rashes or lesions    Vitals:   12/02/21 1612  BP: 122/76  Pulse: 89  Temp: 98.1 F (36.7 C)  TempSrc: Oral  SpO2: 94%  Weight: 243 lb 4.8 oz (110.4 kg)  Height: 5' (1.524 m)    Assessment & Plan:   See Encounters Tab for problem based charting.  Patient discussed with Dr. Lockie Pares, MD, MPH

## 2021-12-02 NOTE — Patient Instructions (Signed)
Thank you, [name] for allowing [name] to provide your care today. Today, we discussed your ear symptoms. On evaluation of your ears, there is no wax however there some signs of an ear infection.  I am prescribing you a short course of antibiotics to help treat this.  I also recommend using over-the-counter sinus rinses and Flonase to relieve the pressure in your sinuses. This will help decrease the pressure you are feeling in your ears.  I have ordered the following medication/changed the following medications:  Start Augmentin 875-125 mg twice daily for 5 days  My Chart Access: [email or web address]  Please follow-up as needed  Please make sure to arrive 15 minutes prior to your next appointment. If you arrive late, you may be asked to reschedule.    We look forward to seeing you next time. Please call our clinic at [phone] if you have any questions or concerns. The best time to call is Monday-Friday from 9am-4pm, but there is someone available 24/7. If after hours or the weekend, call the main hospital number and ask for the Internal Medicine Resident On-Call. If you need medication refills, please notify your pharmacy one week in advance and they will send [name] a request.   Thank you for letting us take part in your care. Wishing you the best!  Lacinda Axon, MD 12/02/2021, 4:32 PM IM Resident, PGY-2 Oswaldo Milian 41:10

## 2021-12-03 ENCOUNTER — Encounter: Payer: Self-pay | Admitting: Student

## 2021-12-03 DIAGNOSIS — H6690 Otitis media, unspecified, unspecified ear: Secondary | ICD-10-CM | POA: Insufficient documentation

## 2021-12-03 HISTORY — DX: Otitis media, unspecified, unspecified ear: H66.90

## 2021-12-03 NOTE — Assessment & Plan Note (Signed)
Young 26 year old F with a recent viral illness presented to clinic today for evaluation of ear fullness, worse on the right. Patient reports she recently got over an upper respiratory tract infection.  She has had no recent fevers, chills, cough, sore throat or chest congestion. Over the last few days, she has had fullness in her ears, especially the right ear. She endorses occasional discomfort in the ear but denies any ear pain. She denies any ear drainage or exposure to pool water. Patient admits to compulsively putting Q-tips in her ears due to concern that she has earwax in her ears. On examination of the ears, the tympanic membrane can be visualized. There is no significant earwax in both ears. There is mild erythema of the canal with bulging right tympanic membrane. There is no drainage or effusion. No tenderness to the pinna.  Patient's symptoms likely due to acute otitis media without effusion or rupture.   Plan: -- Short course of Augmentin 875-125 mg twice daily for 5 days -- Advised to avoid cleaning her ears with Q-tips -- Instructed to use saline rinses to rinse her sinuses and decreased the pressure in her ears -- Advised to follow-up as needed if symptoms worsen.

## 2021-12-06 NOTE — Progress Notes (Signed)
Internal Medicine Clinic Attending  Case discussed with Dr. Amponsah  At the time of the visit.  We reviewed the resident's history and exam and pertinent patient test results.  I agree with the assessment, diagnosis, and plan of care documented in the resident's note.  

## 2022-01-07 ENCOUNTER — Encounter: Payer: Self-pay | Admitting: Internal Medicine

## 2022-01-07 ENCOUNTER — Ambulatory Visit (AMBULATORY_SURGERY_CENTER): Payer: Medicaid Other | Admitting: Internal Medicine

## 2022-01-07 VITALS — BP 119/73 | HR 67 | Temp 98.4°F | Resp 19 | Ht 60.0 in | Wt 245.0 lb

## 2022-01-07 DIAGNOSIS — K2970 Gastritis, unspecified, without bleeding: Secondary | ICD-10-CM | POA: Diagnosis not present

## 2022-01-07 DIAGNOSIS — K200 Eosinophilic esophagitis: Secondary | ICD-10-CM | POA: Diagnosis not present

## 2022-01-07 DIAGNOSIS — K625 Hemorrhage of anus and rectum: Secondary | ICD-10-CM

## 2022-01-07 MED ORDER — SODIUM CHLORIDE 0.9 % IV SOLN: 500.0000 mL | Freq: Once | INTRAVENOUS | Status: DC

## 2022-01-07 NOTE — Op Note (Addendum)
[place] ?Patient Name: Meghan Riggs ?Procedure Date: 01/07/2022 1:36 PM ?MRN: [id number] ?Endoscopist: Sonny Masters "Christia Reading ,  ?Age: 26 ?Referring MD:  ?Date of Birth: Sep 09, 1996 ?Gender: Female ?Account #: [id number] ?Procedure:                Upper GI endoscopy ?Indications:              Follow-up of gastritis ?Medicines:                Monitored Anesthesia Care ?Procedure:                Pre-Anesthesia Assessment: ?                          - Prior to the procedure, a History and Physical  ?                          was performed, and patient medications and  ?                          allergies were reviewed. The patient's tolerance of  ?                          previous anesthesia was also reviewed. The risks  ?                          and benefits of the procedure and the sedation  ?                          options and risks were discussed with the patient.  ?                          All questions were answered, and informed consent  ?                          was obtained. Prior Anticoagulants: The patient has  ?                          taken no previous anticoagulant or antiplatelet  ?                          agents. ASA Grade Assessment: III - A patient with  ?                          severe systemic disease. After reviewing the risks  ?                          and benefits, the patient was deemed in  ?                          satisfactory condition to undergo the procedure. ?                          After obtaining informed consent, the endoscope was  ?  passed under direct vision. Throughout the  ?                          procedure, the patient's blood pressure, pulse, and  ?                          oxygen saturations were monitored continuously. The  ?                          Endoscope was introduced through the mouth, and  ?                          advanced to the second part of duodenum. The upper  ?                          GI endoscopy was  accomplished without difficulty.  ?                          The patient tolerated the procedure well. ?Scope In: ?Scope Out: ?Findings:                 The examined esophagus was normal. ?                          Localized erythematous mucosa without bleeding was  ?                          found in the gastric antrum. ?                          The examined duodenum was normal. ?Complications:            No immediate complications. ?Estimated Blood Loss:     Estimated blood loss: none. ?Impression:               - Normal esophagus. ?                          - Erythematous mucosa in the antrum. ?                          - Normal examined duodenum. ?                          - No specimens collected. ?Recommendation:           - Discharge patient to home (with escort). ?                          - Your gastric ulcers have healed since your prior  ?                          endoscopy procedure. This is good news! ?                          - Return to GI clinic in 2 months. ?                          -  The findings and recommendations were discussed  ?                          with the patient. ?Georgian Co,  ?01/07/2022 1:57:03 PM ?

## 2022-01-07 NOTE — Progress Notes (Signed)
Sedate, gd SR, tolerated procedure well, VSS, report to RN 

## 2022-01-07 NOTE — Progress Notes (Signed)
?  ? ?GASTROENTEROLOGY PROCEDURE H&P NOTE  ? ?Primary Care Physician: ?Delene Ruffini, MD ? ? ? ?Reason for Procedure:   Follow up healing of gastric ulcers ? ?Plan:    EGD ? ?Patient is appropriate for endoscopic procedure(s) in the ambulatory ([place]) setting. ? ?The nature of the procedure, as well as the risks, benefits, and alternatives were carefully and thoroughly reviewed with the patient. Ample time for discussion and questions allowed. The patient understood, was satisfied, and agreed to proceed.  ? ? ? ?HPI: ?Meghan Riggs is a 26 y.o. female who presents for EGD for follow up of gastric ulcers. ? ?Past Medical History:  ?Diagnosis Date  ? Anemia   ? Anxiety   ? Arthritis   ? Asthma   ? Dr. Donneta Romberg  ? Benign liver cyst   ? Club foot 1996  ? Congenital  ? Depression   ? DM (diabetes mellitus) ([place])   ? Gastritis   ? HTN (hypertension)   ? Hyperlipidemia   ? Internal hemorrhoids   ? Osteoarthritis   ? ? ?Past Surgical History:  ?Procedure Laterality Date  ? AXILLARY LYMPH NODE DISSECTION Left 2006  ? CESAREAN SECTION  07/2020  ? CLUB FOOT RELEASE Bilateral 8627  ? @ 53 days old  ? COLONOSCOPY    ? KNEE SURGERY Left 2010  ? LAPAROSCOPIC APPENDECTOMY N/A 06/04/2017  ? Procedure: APPENDECTOMY LAPAROSCOPIC;  Surgeon: Erroll Luna, MD;  Location: [facility];  Service: General;  Laterality: N/A;  ? UPPER GASTROINTESTINAL ENDOSCOPY    ? ? ?Prior to Admission medications   ?Medication Sig Start Date End Date Taking? Authorizing Provider  ?albuterol (PROVENTIL HFA;VENTOLIN HFA) 108 (90 Base) MCG/ACT inhaler Inhale 1-2 puffs into the lungs every 6 (six) hours as needed for wheezing or shortness of breath. 09/10/18   Marcelyn Bruins, MD  ?FLUoxetine (PROZAC) 40 MG capsule Take 40 mg by mouth daily. 08/16/21   [provider]  ?hydrOXYzine (ATARAX) 10 MG tablet Take 10 mg by mouth 3 (three) times daily. 05/25/21   [provider]  ?hydrOXYzine (ATARAX) 50 MG tablet Take 50 mg by mouth at bedtime.     [provider]  ?omeprazole (PRILOSEC) 40 MG capsule Take 1 capsule (40 mg total) by mouth in the morning and at bedtime. 11/05/21   Sharyn Creamer, MD  ?sucralfate (CARAFATE) 1 GM/10ML suspension Take 10 mLs (1 g total) by mouth 4 (four) times daily. 11/05/21   Sharyn Creamer, MD  ? ? ?Current Outpatient Medications  ?Medication Sig Dispense Refill  ? albuterol (PROVENTIL HFA;VENTOLIN HFA) 108 (90 Base) MCG/ACT inhaler Inhale 1-2 puffs into the lungs every 6 (six) hours as needed for wheezing or shortness of breath. 1 Inhaler 3  ? FLUoxetine (PROZAC) 40 MG capsule Take 40 mg by mouth daily.    ? hydrOXYzine (ATARAX) 10 MG tablet Take 10 mg by mouth 3 (three) times daily.    ? hydrOXYzine (ATARAX) 50 MG tablet Take 50 mg by mouth at bedtime.    ? omeprazole (PRILOSEC) 40 MG capsule Take 1 capsule (40 mg total) by mouth in the morning and at bedtime. 120 capsule 0  ? sucralfate (CARAFATE) 1 GM/10ML suspension Take 10 mLs (1 g total) by mouth 4 (four) times daily. 420 mL 1  ? ?No current facility-administered medications for this visit.  ? ? ?Allergies as of 01/07/2022  ? (No Known Allergies)  ? ? ?Family History  ?Problem Relation Age of Onset  ? COPD  Mother   ? Asthma Mother   ? Cervical cancer Mother   ? Interstitial cystitis Mother   ? Graves' disease Mother   ? Diabetes Father   ? Brain cancer Father   ? Heart failure Father   ? Club foot Brother   ? COPD Maternal Grandmother   ? Asthma Maternal Grandmother   ? Cirrhosis Maternal Grandfather   ? Diabetes Paternal Grandmother   ? Breast cancer Paternal Grandmother   ? Heart disease Paternal Grandfather   ? Diabetes Paternal Uncle   ? Colon cancer Neg Hx   ? Colon polyps Neg Hx   ? ? ?Social History  ? ?Socioeconomic History  ? Marital status: Significant Other  ?  Spouse name: Not on file  ? Number of children: 1  ? Years of education: Not on file  ? Highest education level: Not on file  ?Occupational History  ? Occupation: SAHM  ?Tobacco Use  ? Smoking  status: Some Days  ?  Types: Cigarettes  ? Smokeless tobacco: Never  ? Tobacco comments:  ?  1  ?Vaping Use  ? Vaping Use: Never used  ?Substance and Sexual Activity  ? Alcohol use: Yes  ?  Comment: seldom  ? Drug use: No  ?  Comment: hx marijuana use per [facility] record 01/29/20  ? Sexual activity: Yes  ?  Birth control/protection: Inserts, None  ?Other Topics Concern  ? Not on file  ?Social History Narrative  ? Not on file  ? ?Social Determinants of Health  ? ?Financial Resource Strain: Not on file  ?Food Insecurity: Not on file  ?Transportation Needs: Not on file  ?Physical Activity: Not on file  ?Stress: Not on file  ?Social Connections: Not on file  ?Intimate Partner Violence: Not on file  ? ? ?Physical Exam: ?Vital signs in last 24 hours: ?There were no vitals taken for this visit. ?GEN: NAD ?EYE: Sclerae anicteric ?ENT: MMM ?CV: Non-tachycardic ?Pulm: No increased work of breathing ?GI: Soft, NT/ND ?NEURO:  Alert & Oriented ? ? ?Christia Reading, MD ?[facility] ? ?01/07/2022 1:06 PM ? ?

## 2022-01-07 NOTE — Patient Instructions (Signed)
Return to GI clinic in 2 months. ? ? ?YOU HAD AN ENDOSCOPIC PROCEDURE TODAY AT [facility]:   Refer to the procedure report that was given to you for any specific questions about what was found during the examination.  If the procedure report does not answer your questions, please call your gastroenterologist to clarify.  If you requested that your care partner not be given the details of your procedure findings, then the procedure report has been included in a sealed envelope for you to review at your convenience later. ? ?YOU SHOULD EXPECT: Some feelings of bloating in the abdomen. Passage of more gas than usual.  Walking can help get rid of the air that was put into your GI tract during the procedure and reduce the bloating. If you had a lower endoscopy (such as a colonoscopy or flexible sigmoidoscopy) you may notice spotting of blood in your stool or on the toilet paper. If you underwent a bowel prep for your procedure, you may not have a normal bowel movement for a few days. ? ?Please Note:  You might notice some irritation and congestion in your nose or some drainage.  This is from the oxygen used during your procedure.  There is no need for concern and it should clear up in a day or so. ? ?SYMPTOMS TO REPORT IMMEDIATELY: ? ? ?Following upper endoscopy (EGD) ? Vomiting of blood or coffee ground material ? New chest pain or pain under the shoulder blades ? Painful or persistently difficult swallowing ? New shortness of breath ? Fever of 100?F or higher ? Black, tarry-looking stools ? ?For urgent or emergent issues, a gastroenterologist can be reached at any hour by calling [phone]. ?Do not use MyChart messaging for urgent concerns.  ? ? ?DIET:  We do recommend a small meal at first, but then you may proceed to your regular diet.  Drink plenty of fluids but you should avoid alcoholic beverages for 24 hours. ? ?ACTIVITY:  You should plan to take it easy for the rest of today and you should  NOT DRIVE or use heavy machinery until tomorrow (because of the sedation medicines used during the test).   ? ?FOLLOW UP: ?Our staff will call the number listed on your records 48-72 hours following your procedure to check on you and address any questions or concerns that you may have regarding the information given to you following your procedure. If we do not reach you, we will leave a message.  We will attempt to reach you two times.  During this call, we will ask if you have developed any symptoms of COVID 19. If you develop any symptoms (ie: fever, flu-like symptoms, shortness of breath, cough etc.) before then, please call [phone].  If you test positive for Covid 19 in the 2 weeks post procedure, please call and report this information to [name].   ? ?If any biopsies were taken you will be contacted by phone or by letter within the next 1-3 weeks.  Please call us at [phone] if you have not heard about the biopsies in 3 weeks.  ? ? ?SIGNATURES/CONFIDENTIALITY: ?You and/or your care partner have signed paperwork which will be entered into your electronic medical record.  These signatures attest to the fact that that the information above on your After Visit Summary has been reviewed and is understood.  Full responsibility of the confidentiality of this discharge information lies with you and/or your care-partner.  ?

## 2022-01-11 ENCOUNTER — Telehealth: Payer: Self-pay | Admitting: *Deleted

## 2022-01-11 NOTE — Telephone Encounter (Signed)
?  Follow up Call- ? ? ?  01/07/2022  ?  1:21 PM 11/05/2021  ?  1:26 PM  ?Call back number  ?Post procedure Call Back phone  # [phone]  ?Permission to leave phone message Yes Yes  ?  ? ?Patient questions: ? ?Do you have a fever, pain , or abdominal swelling? No. ?Pain Score  0 * ? ?Have you tolerated food without any problems? Yes.   ? ?Have you been able to return to your normal activities? Yes.   ? ?Do you have any questions about your discharge instructions: ?Diet   No. ?Medications  No. ?Follow up visit  No. ? ?Do you have questions or concerns about your Care? No. ? ?Actions: ?* If pain score is 4 or above: ?No action needed, pain <4. ? ? ?

## 2022-03-04 ENCOUNTER — Ambulatory Visit (INDEPENDENT_AMBULATORY_CARE_PROVIDER_SITE_OTHER): Payer: Medicaid Other | Admitting: Internal Medicine

## 2022-03-04 ENCOUNTER — Other Ambulatory Visit: Payer: Self-pay

## 2022-03-04 ENCOUNTER — Encounter: Payer: Self-pay | Admitting: Internal Medicine

## 2022-03-04 VITALS — BP 119/64 | HR 75 | Temp 98.6°F | Resp 24 | Ht 60.0 in | Wt 245.5 lb

## 2022-03-04 DIAGNOSIS — R61 Generalized hyperhidrosis: Secondary | ICD-10-CM

## 2022-03-04 DIAGNOSIS — L304 Erythema intertrigo: Secondary | ICD-10-CM | POA: Diagnosis not present

## 2022-03-04 DIAGNOSIS — Z6841 Body Mass Index (BMI) 40.0 and over, adult: Secondary | ICD-10-CM | POA: Diagnosis not present

## 2022-03-04 MED ORDER — NYSTATIN 100000 UNIT/GM EX POWD: 1.0000 "application " | Freq: Three times a day (TID) | CUTANEOUS | 0 refills | Status: DC

## 2022-03-04 NOTE — Progress Notes (Signed)
CC: Rash under R breast  HPI:  [name] is a 26 y.o. female with a past medical history stated below.   Intertrigo Patient with past medical history of eczema, obesity and hyperhidrosis presents with acute complaint of pruritic rash under right breast.  Rash began 1 week ago started as itchy red bumps that were so itchy that her itching has caused bleeding and scarring.  Initially she thought this could be eczema or poison ivy though trying Benadryl cream, hydrocortisone cream, other anti-itch cream, Desitin diaper rash cream has not provided relief.  She notes no white residue, she does note serosanguineous fluid that is crusted.  Today she reports her rash is more tender than it has been the last few days.  On exam patient has light pink poorly circumscribed confluent papular rash just under medial side of the right breast.  See photo under media tab.  Given history of obesity, hyperhidrosis suspect this may be intertrigo.  We will try nystatin powder.  Discussed with patient if her rash does not improve she should contact us and we can switch to prescription steroid cream to treat atopic dermatitis. Plan: -Discussed importance of keeping area dry and clean -Nystatin powder up to 3 times daily  Obesity Weight today 245 pounds and BMI 47. TSH Nov 2022 normal. Patient's weight has been in the 240 since November of last year despite her trying to make dietary changes, drink water, no late snacking.  She has been diagnosed with OSA and obesity hypoventilation syndrome.  This is distressing for her.  She reports some binge eating behavior though does not have diagnosis of binge eating disorder per her psychiatrist who treats her for PTSD and GAD.  She is currently on Prozac 40 and hydroxyzine for her anxiety.  She is seen a nutritionist in the past and is aware of the changes that she needs to make to her diet.  Previously she has wanted to steer away from surgical interventions for  weight loss though now recently is considering weight loss surgery.  On assessment, patient is obese with high BMI 47.  She has made lifestyle modifications though suspect component of binge eating disorder.  She is already on Prozac 40 mg which can help with binge eating disorder and there is room to increase this medication.  She may want to discuss this with her psychiatrist.  Patient may also benefit from referral to weight loss clinic.  Her health insurance coverage is [place] Medicaid healthy Blue and they may not cover GLP1 for weight loss for her, though feel that she would do well with this kind of medication.  Hyperhidrosis Patient reports hyperhidrosis symptoms that are chronic and wonders if there is a medication she can take to help with this.  She sweats every day regardless of the season.  She will sweat through her shirts during both daytime and overnight.  Has been using men's deodorant to control swelling.  Suspect hyperhidrosis secondary to obesity and antidepressant use.  Anticholinergics can be used to treat sweating though these have numerous side effects.  I suspect weight loss will help with hyperhidrosis.    Past Medical History:  Diagnosis Date   Acute otitis media 12/03/2021   Anemia    Anxiety    Arthritis    Asthma    Dr. Donneta Romberg   Benign liver cyst    Club foot 1996   Congenital   Depression    DM (diabetes mellitus) ([place])    Gastritis  GERD (gastroesophageal reflux disease)    HTN (hypertension)    Hyperlipidemia    Internal hemorrhoids    Osteoarthritis    Viral URI with cough 11/22/2021    Current Outpatient Medications on File Prior to Visit  Medication Sig Dispense Refill   albuterol (PROVENTIL HFA;VENTOLIN HFA) 108 (90 Base) MCG/ACT inhaler Inhale 1-2 puffs into the lungs every 6 (six) hours as needed for wheezing or shortness of breath. (Patient not taking: Reported on 01/07/2022) 1 Inhaler 3   FLUoxetine (PROZAC) 40 MG capsule Take 40 mg by mouth  daily.     hydrOXYzine (ATARAX) 10 MG tablet Take 10 mg by mouth 3 (three) times daily.     omeprazole (PRILOSEC) 40 MG capsule Take 1 capsule (40 mg total) by mouth in the morning and at bedtime. 120 capsule 0   sucralfate (CARAFATE) 1 GM/10ML suspension Take 10 mLs (1 g total) by mouth 4 (four) times daily. 420 mL 1   No current facility-administered medications on file prior to visit.    Family History  Problem Relation Age of Onset   COPD Mother    Asthma Mother    Cervical cancer Mother    Interstitial cystitis Mother    Berenice Primas' disease Mother    Diabetes Father    Brain cancer Father    Heart failure Father    Club foot Brother    Diabetes Paternal Uncle    COPD Maternal Grandmother    Asthma Maternal Grandmother    Cirrhosis Maternal Grandfather    Diabetes Paternal Grandmother    Breast cancer Paternal Grandmother    Heart disease Paternal Grandfather    Colon cancer Neg Hx    Colon polyps Neg Hx    Esophageal cancer Neg Hx    Rectal cancer Neg Hx    Stomach cancer Neg Hx     Review of Systems: ROS negative except for what is noted on the assessment and plan.  Vitals:   03/04/22 1055  BP: 119/64  Pulse: 75  Resp: (!) 24  Temp: 98.6 F (37 C)  TempSrc: Oral  SpO2: 99%  Weight: 245 lb 8 oz (111.4 kg)  Height: 5' (1.524 m)     Physical Exam: General: Well appearing obese Caucasian female, NAD HENT: normocephalic, atraumatic, external ears and nares appear unremarkable EYES: conjunctiva non-erythematous, no scleral icterus CV: regular rate, normal rhythm, no murmurs, rubs, gallops.  Pulmonary: normal work of breathing on RA, lungs clear to auscultation, no rales, wheezes, rhonchi Skin: Light pink poorly circumscribed confluent papular rash just under medial side of right breast.  See media photo. Neurological: MS: awake, alert and oriented x3, normal speech and fund of knowledge Motor: moves all extremities antigravity Psych: normal affect     See  Encounters Tab for problem based charting.  Patient discussed with Dr. Illene Regulus, M.D. [facility], PGY-1 Pager: [phone] Date 03/07/2022 Time 1:53 PM

## 2022-03-04 NOTE — Patient Instructions (Signed)
Thank you, [name] for allowing [name] to provide your care today. Today we discussed:  Rash under breast: -I have sent in prescription for nystatin powder. You can apply up to three times daily. It can treat an infection that tends to grow in moist/warm environments on our skin. These are very common.  Come back in 1-2 weeks to discuss excess sweating and options for weight loss.  My Chart Access: [email or web address]  Please follow-up in 1-2 weeks for follow up.  Please make sure to arrive 15 minutes prior to your next appointment. If you arrive late, you may be asked to reschedule.    We look forward to seeing you next time. Please call our clinic at [phone] if you have any questions or concerns. The best time to call is Monday-Friday from 9am-4pm, but there is someone available 24/7. If after hours or the weekend, call the main hospital number and ask for the Internal Medicine Resident On-Call. If you need medication refills, please notify your pharmacy one week in advance and they will send [name] a request.   Thank you for letting us take part in your care. Wishing you the best!  Wayland Denis, MD 03/04/2022, 11:38 AM IM Resident, PGY-1

## 2022-03-07 ENCOUNTER — Encounter: Payer: Self-pay | Admitting: Internal Medicine

## 2022-03-07 DIAGNOSIS — L304 Erythema intertrigo: Secondary | ICD-10-CM | POA: Insufficient documentation

## 2022-03-07 DIAGNOSIS — E669 Obesity, unspecified: Secondary | ICD-10-CM | POA: Insufficient documentation

## 2022-03-07 DIAGNOSIS — R61 Generalized hyperhidrosis: Secondary | ICD-10-CM | POA: Insufficient documentation

## 2022-03-07 NOTE — Assessment & Plan Note (Addendum)
Weight today 245 pounds and BMI 47. TSH Nov 2022 normal. Patient's weight has been in the 240 since November of last year despite her trying to make dietary changes, drink water, no late snacking.  She has been diagnosed with OSA and obesity hypoventilation syndrome.  This is distressing for her.  She reports some binge eating behavior though does not have diagnosis of binge eating disorder per her psychiatrist who treats her for PTSD and GAD.  She is currently on Prozac 40 and hydroxyzine for her anxiety.  She is seen a nutritionist in the past and is aware of the changes that she needs to make to her diet.  Previously she has wanted to steer away from surgical interventions for weight loss though now recently is considering weight loss surgery.  On assessment, patient is obese with high BMI 47.  She has made lifestyle modifications though suspect component of binge eating disorder.  She is already on Prozac 40 mg which can help with binge eating disorder and there is room to increase this medication.  She may want to discuss this with her psychiatrist.  Patient may also benefit from referral to weight loss clinic.  Her health insurance coverage is [place] Medicaid healthy Blue and they may not cover GLP1 for weight loss for her, though feel that she would do well with this kind of medication.

## 2022-03-07 NOTE — Assessment & Plan Note (Signed)
Patient reports hyperhidrosis symptoms that are chronic and wonders if there is a medication she can take to help with this.  She sweats every day regardless of the season.  She will sweat through her shirts during both daytime and overnight.  Has been using men's deodorant to control swelling.  Suspect hyperhidrosis secondary to obesity and antidepressant use.  Anticholinergics can be used to treat sweating though these have numerous side effects.  I suspect weight loss will help with hyperhidrosis.

## 2022-03-07 NOTE — Assessment & Plan Note (Signed)
Patient with past medical history of eczema, obesity and hyperhidrosis presents with acute complaint of pruritic rash under right breast.  Rash began 1 week ago started as itchy red bumps that were so itchy that her itching has caused bleeding and scarring.  Initially she thought this could be eczema or poison ivy though trying Benadryl cream, hydrocortisone cream, other anti-itch cream, Desitin diaper rash cream has not provided relief.  She notes no white residue, she does note serosanguineous fluid that is crusted.  Today she reports her rash is more tender than it has been the last few days.  On exam patient has light pink poorly circumscribed confluent papular rash just under medial side of the right breast.  See photo under media tab.  Given history of obesity, hyperhidrosis suspect this may be intertrigo.  We will try nystatin powder.  Discussed with patient if her rash does not improve she should contact us and we can switch to prescription steroid cream to treat atopic dermatitis. Plan: -Discussed importance of keeping area dry and clean -Nystatin powder up to 3 times daily

## 2022-03-11 ENCOUNTER — Ambulatory Visit: Payer: Medicaid Other | Admitting: Internal Medicine

## 2022-03-16 NOTE — Progress Notes (Signed)
Internal Medicine Clinic Attending ? ?Case discussed with Dr. Zinoviev  At the time of the visit.  We reviewed the resident?s history and exam and pertinent patient test results.  I agree with the assessment, diagnosis, and plan of care documented in the resident?s note.  ?

## 2022-03-18 ENCOUNTER — Encounter: Payer: Self-pay | Admitting: Internal Medicine

## 2022-03-18 ENCOUNTER — Ambulatory Visit (INDEPENDENT_AMBULATORY_CARE_PROVIDER_SITE_OTHER): Payer: Medicaid Other | Admitting: Internal Medicine

## 2022-03-18 VITALS — BP 126/83 | HR 79 | Temp 97.6°F | Ht 60.0 in | Wt 242.2 lb

## 2022-03-18 DIAGNOSIS — Z6841 Body Mass Index (BMI) 40.0 and over, adult: Secondary | ICD-10-CM | POA: Diagnosis not present

## 2022-03-18 DIAGNOSIS — H52209 Unspecified astigmatism, unspecified eye: Secondary | ICD-10-CM | POA: Diagnosis not present

## 2022-03-18 DIAGNOSIS — R61 Generalized hyperhidrosis: Secondary | ICD-10-CM

## 2022-03-18 LAB — POCT GLYCOSYLATED HEMOGLOBIN (HGB A1C): Hemoglobin A1C: 5.5 % (ref 4.0–5.6)

## 2022-03-18 LAB — GLUCOSE, CAPILLARY: Glucose-Capillary: 82 mg/dL (ref 70–99)

## 2022-03-18 NOTE — Assessment & Plan Note (Signed)
Patient reports that this is a chronic issue for her. Here to discuss the possibility of starting a medication. Suspect 2/2 obesity and possibly antidepressant use. Discussed that will obtain TSH first (last was WNL from Dec 2022) and if normal will discuss further over the phone next week.  -TSH pending

## 2022-03-18 NOTE — Assessment & Plan Note (Addendum)
-  Referral to optometry per pt request

## 2022-03-18 NOTE — Progress Notes (Signed)
   CC: hyperhidrosis/ weight loss follow-up  HPI:  [name] is a 26 y.o. with past medical history as noted below who presents to the clinic today for hyperhidrosis follow-up. Please see problem-based list for further details, assessments, and plans.   Past Medical History:  Diagnosis Date   Acute otitis media 12/03/2021   Anemia    Anxiety    Arthritis    Asthma    Dr. Donneta Romberg   Benign liver cyst    Club foot 1996   Congenital   Depression    DM (diabetes mellitus) ([place])    Gastritis    GERD (gastroesophageal reflux disease)    HTN (hypertension)    Hyperlipidemia    Internal hemorrhoids    Osteoarthritis    Viral URI with cough 11/22/2021   Review of Systems: Negative aside from that listed in individualized problem based charting.   Physical Exam:  Vitals:   03/18/22 1033  BP: 126/83  Pulse: 79  Temp: 97.6 F (36.4 C)  TempSrc: Oral  SpO2: 98%  Weight: 242 lb 3.2 oz (109.9 kg)  Height: 5' (1.524 m)   General: NAD, nl appearance HE: Normocephalic, atraumatic, EOMI, Conjunctivae normal ENT: No congestion, no rhinorrhea, no exudate or erythema  Cardiovascular: Normal rate, regular rhythm. No murmurs, rubs, or gallops Pulmonary: Effort normal, breath sounds normal. No wheezes, rales, or rhonchi Abdominal: soft, nontender, bowel sounds present Musculoskeletal: no swelling, deformity, injury or tenderness in extremities Skin: Warm, dry, no bruising, erythema, or rash Psychiatric/Behavioral: normal mood, normal behavior      Assessment & Plan:   See Encounters Tab for problem based charting.  Patient discussed with Dr. Evette Doffing

## 2022-03-18 NOTE — Patient Instructions (Signed)
Thank you, [name] for allowing [name] to provide your care today. Today we discussed your sweating and weight loss options.    I have referred you to our weight loss clinic, though there is a long weight list. I will obtain an A1c and call you with these results.  I will obtain thyroid labs and call you with these results.  I have placed an order for an optometrist as well.  I have ordered the following labs for you:  Lab Orders         TSH         POC Hbg A1C       Referrals ordered today:   Referral Orders         Amb Ref to Medical Weight Management         Ambulatory referral to Optometry       I have ordered the following medication/changed the following medications:   Stop the following medications: There are no discontinued medications.   Start the following medications: No orders of the defined types were placed in this encounter.    Follow up: Will call you with lab results next week!   Should you have any questions or concerns please call the internal medicine clinic at [phone].

## 2022-03-18 NOTE — Assessment & Plan Note (Signed)
Patient is here to discuss options for weight loss. States that obesity has been an issue for her for most of her life.  Couple years ago, she was referred to a nutritionist and started counting carbohydrates.  However, states that this did not help her and rather caused her a lot of anxiety.  She will often only eat 1 meal a day and later binge eat to compensate.  She has not tried medications in the past before and she has not been to a weight loss clinic.  She is asking about Ozempic today.  Patient states that she would want to pay as little as possible out of pocket.  Plan: We will refer to the weight loss clinic today, though discussed that there is typically a long wait list for this.  Her last A1c was normal in 2021, but will obtain repeat A1c today.  Ozempic would not be covered by her current insurance plan unless found to have diabetes and trialed metformin first.   -A1c pending -Weight loss clinic referral

## 2022-03-19 LAB — TSH: TSH: 4.55 u[IU]/mL — ABNORMAL HIGH (ref 0.450–4.500)

## 2022-03-22 ENCOUNTER — Telehealth: Payer: Self-pay | Admitting: Internal Medicine

## 2022-03-22 ENCOUNTER — Other Ambulatory Visit (HOSPITAL_COMMUNITY): Payer: Self-pay

## 2022-03-22 ENCOUNTER — Encounter: Payer: Self-pay | Admitting: Internal Medicine

## 2022-03-22 MED ORDER — ALUMINUM CHLORIDE HEXAHYDRATE POWD: 1.0000 | Freq: Every evening | 0 refills | Status: DC

## 2022-03-22 MED ORDER — DRYSOL 20 % EX SOLN: Freq: Every day | CUTANEOUS | 2 refills | Status: AC

## 2022-03-22 NOTE — Telephone Encounter (Signed)
Attempted to contact patient with lab results and plan of action. No answer, left VM.

## 2022-03-22 NOTE — Progress Notes (Signed)
Internal Medicine Clinic Attending  Case discussed with Dr. Lorin Glass  At the time of the visit.  We reviewed the resident's history and exam and pertinent patient test results.  I agree with the assessment, diagnosis, and plan of care documented in the resident's note.   She seems to have no complications of diabetes right now. FDA approved medications for obesity in her case would be Wegovy injection as first line, and oral Qsymia as second line. We will check the cost of those meds with her insurance and get an estimate of the out of pocket expense.   For hyperhidrosis, I wonder if she has tried prescription grade antiperspirant. Could try aluminum chloride hexahydrate topical 20%, apply nightly for 5 nights, then 1-2 times a week as needed to control sweating. The aluminum salts will deposit in sweat glands, so works best on dry skin at night. It can be washed away in the morning. Will need to warn her that contact and irritant dermatitis are common side effects.

## 2022-03-22 NOTE — Addendum Note (Signed)
Addended by: Orvis Brill on: 03/22/2022 03:51 PM   Modules accepted: Orders

## 2022-03-23 ENCOUNTER — Encounter: Payer: Self-pay | Admitting: Internal Medicine

## 2022-04-06 ENCOUNTER — Other Ambulatory Visit: Payer: Medicaid Other

## 2022-04-07 ENCOUNTER — Other Ambulatory Visit: Payer: Medicaid Other

## 2022-04-13 ENCOUNTER — Other Ambulatory Visit: Payer: Medicaid Other

## 2022-05-02 ENCOUNTER — Encounter: Payer: Self-pay | Admitting: Internal Medicine

## 2022-05-05 ENCOUNTER — Other Ambulatory Visit: Payer: Self-pay | Admitting: Internal Medicine

## 2022-06-24 ENCOUNTER — Other Ambulatory Visit: Payer: Self-pay | Admitting: Internal Medicine

## 2022-08-01 ENCOUNTER — Encounter (INDEPENDENT_AMBULATORY_CARE_PROVIDER_SITE_OTHER): Payer: Self-pay | Admitting: Internal Medicine

## 2022-08-01 ENCOUNTER — Ambulatory Visit (INDEPENDENT_AMBULATORY_CARE_PROVIDER_SITE_OTHER): Payer: Medicaid Other | Admitting: Internal Medicine

## 2022-08-01 VITALS — BP 131/83 | HR 91 | Temp 97.8°F | Ht 60.0 in | Wt 225.6 lb

## 2022-08-01 DIAGNOSIS — G4733 Obstructive sleep apnea (adult) (pediatric): Secondary | ICD-10-CM | POA: Diagnosis not present

## 2022-08-01 DIAGNOSIS — Z8632 Personal history of gestational diabetes: Secondary | ICD-10-CM | POA: Diagnosis not present

## 2022-08-01 DIAGNOSIS — Z6841 Body Mass Index (BMI) 40.0 and over, adult: Secondary | ICD-10-CM

## 2022-08-01 NOTE — Progress Notes (Signed)
Office: [phone]  /  Fax: [phone]   Initial Visit  Meghan Riggs was seen in clinic today to evaluate for obesity. She is interested in losing weight to improve overall health and reduce the risk of weight related complications. She presents today to review program treatment options, initial physical assessment, and evaluation.     She was referred by: PCP  When asked what else they would like to accomplish? She states: Improve existing medical conditions and Improve quality of life  When asked how has your weight affected you? She states: Contributed to medical problems, Having fatigue, Having poor endurance, and Problems with eating patterns  Some associated conditions: OSA and Other: History of gestational diabetes  Contributing factors: Family history, Disruption of circadian rhythm, Nutritional, Eating patterns, and Pregnancy  Weight promoting medications identified: None  Current nutrition plan: None  Current level of physical activity: None  Current or previous pharmacotherapy: None  Response to medication: Never tried medications   Past medical history includes:   Past Medical History:  Diagnosis Date   Acute otitis media 12/03/2021   Anemia    Anxiety    Arthritis    Asthma    Dr. Donneta Romberg   Benign liver cyst    Club foot 1996   Congenital   Depression    DM (diabetes mellitus) ([place])    Gastritis    GERD (gastroesophageal reflux disease)    HTN (hypertension)    Hyperlipidemia    Internal hemorrhoids    Osteoarthritis    Viral URI with cough 11/22/2021     Objective:   BP 131/83   Pulse 91   Temp 97.8 F (36.6 C)   Ht 5' (1.524 m)   Wt 225 lb 9.6 oz (102.3 kg)   SpO2 98%   BMI 44.06 kg/m  She was weighed on the bioimpedance scale: Body mass index is 44.06 kg/m.  Peak Weight:250 lbs ,Visceral Fat Rating:13, Body Fat%:49.9, Weight trend over the last 12 months: Decreasing  General:  Alert, oriented and cooperative. Patient is in no  acute distress.  Respiratory: Normal respiratory effort, no problems with respiration noted  Extremities: Normal range of motion.    Mental Status: Normal mood and affect. Normal behavior. Normal judgment and thought content.   Assessment and Plan:  1. Class 3 severe obesity without serious comorbidity with body mass index (BMI) of 40.0 to 44.9 in adult, unspecified obesity type ([place]) We reviewed weight, biometrics, associated medical conditions and contributing factors with patient. She would benefit from weight loss therapy via a modified calorie, low-carb, high-protein nutritional plan tailored to their REE (resting energy expenditure) which will be determined by indirect calorimetry.  We will also assess for cardiometabolic risk and nutritional derangements via fasting serologies at her next appointment.   2. Hx gestational diabetes Patient reports having gestational diabetes during her pregnancy.  She also had elevated blood pressure and edema.  Patient counseled on risk of developing diabetes over the next 5 years.  She has strong family history of diabetes.  She will benefit from weight loss therapy for prevention.  3. OSA (obstructive sleep apnea) According to patient she underwent a sleep study that showed she has obstructive sleep apnea.  She is scheduled to undergo additional testing possibly titration study and evaluation for sleep latency because of daytime somnolence.  Sleep disordered breathing may contribute to weight gain.        Obesity Treatment / Action Plan:  Patient will work on garnering support from  family and friends to begin weight loss journey. Will work on eliminating or reducing the presence of highly palatable, calorie dense foods in the home. Will complete provided nutritional and psychosocial assessment questionnaire before the next appointment. Will be scheduled for indirect calorimetry to determine resting energy expenditure in a fasting state.  This will  allow [name] to create a reduced calorie, high-protein meal plan to promote loss of fat mass while preserving muscle mass. Will avoid skipping meals which may result in increased hunger signals and overeating at certain times. Will reduce liquid calories and sugary drinks from diet. Will work on improving sleep hygiene and trying to obtain at least 7 hours of sleep. Was counseled on nutritional approaches to weight loss and benefits of complex carbs and high quality protein as part of nutritional weight management.  Obesity Education Performed Today:  She was weighed on the bioimpedance scale and results were discussed and documented in the synopsis.  We discussed obesity as a disease and the importance of a more detailed evaluation of all the factors contributing to the disease.  We discussed the importance of long term lifestyle changes which include nutrition, exercise and behavioral modifications as well as the importance of customizing this to her specific health and social needs.  We discussed the benefits of reaching a healthier weight to alleviate the symptoms of existing conditions and reduce the risks of the biomechanical, metabolic and psychological effects of obesity.  Julaine L Prows appears to be in the action stage of change and states they are ready to start intensive lifestyle modifications and behavioral modifications.  30 minutes was spent today on this visit including the above counseling, pre-visit chart review, and post-visit documentation.  Thomes Dinning, MD

## 2023-02-16 ENCOUNTER — Ambulatory Visit (INDEPENDENT_AMBULATORY_CARE_PROVIDER_SITE_OTHER): Payer: Medicaid Other | Admitting: Student

## 2023-02-16 ENCOUNTER — Encounter: Payer: Medicaid Other | Admitting: Student

## 2023-02-16 ENCOUNTER — Encounter: Payer: Self-pay | Admitting: Student

## 2023-02-16 ENCOUNTER — Other Ambulatory Visit: Payer: Self-pay

## 2023-02-16 VITALS — BP 112/67 | HR 80 | Temp 98.0°F | Ht 60.0 in | Wt 234.5 lb

## 2023-02-16 DIAGNOSIS — Z3201 Encounter for pregnancy test, result positive: Secondary | ICD-10-CM | POA: Diagnosis present

## 2023-02-16 DIAGNOSIS — N926 Irregular menstruation, unspecified: Secondary | ICD-10-CM

## 2023-02-16 DIAGNOSIS — N912 Amenorrhea, unspecified: Secondary | ICD-10-CM

## 2023-02-16 DIAGNOSIS — Z3490 Encounter for supervision of normal pregnancy, unspecified, unspecified trimester: Secondary | ICD-10-CM

## 2023-02-16 DIAGNOSIS — J45909 Unspecified asthma, uncomplicated: Secondary | ICD-10-CM

## 2023-02-16 LAB — POCT URINE PREGNANCY: Preg Test, Ur: POSITIVE — AB

## 2023-02-16 MED ORDER — LORATADINE 10 MG PO TABS: 10.0000 mg | ORAL_TABLET | Freq: Every day | ORAL | 2 refills | Status: AC

## 2023-02-16 NOTE — Progress Notes (Signed)
   CC: Delayed menstrual cycle  HPI:  Meghan Riggs is a 27 y.o. female with PMH as below who presents to the clinic for pregnancy testing after a delayed menstrual cycle and positive home pregnancy test.  She has no other concerns at this time.  Past Medical History:  Diagnosis Date   Acute otitis media 12/03/2021   Anemia    Anxiety    Arthritis    Asthma    Dr. Armour Callas   Benign liver cyst    Club foot 1996   Congenital   Depression    DM (diabetes mellitus) ([facility])    Gastritis    GERD (gastroesophageal reflux disease)    HTN (hypertension)    Hyperlipidemia    Internal hemorrhoids    Osteoarthritis    Viral URI with cough 11/22/2021   Review of Systems:   Pertinent items noted in HPI and/or A&P.  Physical Exam:  Vitals:   02/16/23 1408  BP: 112/67  Pulse: 80  Temp: 98 F (36.7 C)  TempSrc: Oral  SpO2: 99%  Weight: 234 lb 8 oz (106.4 kg)  Height: 5' (1.524 m)    Constitutional: Well-appearing adult female. In no acute distress. HEENT: Normocephalic, atraumatic, Sclera non-icteric, PERRL, EOM intact Cardio:Regular rate and rhythm. 2+ bilateral radial pulses. Pulm:Clear to auscultation bilaterally. Normal work of breathing on room air. Abdomen: Soft, non-tender, non-distended, positive bowel sounds. ZOX:WRUEAVWU for extremity edema. Skin:Warm and dry. Neuro:Alert and oriented x3. No focal deficit noted. Psych:Pleasant mood and affect.   Assessment & Plan:   Pregnancy Presented after a positive home pregnancy test and beta-hCG qualitative here was positive as well.  She plans to reestablish with her prior OB/GYN and will see them this week.  She had questions about the safety of atomoxetine, fluoxetine, and hydroxyzine during pregnancy.  She had stopped taking these whenever she thought she may be pregnant a few weeks ago.  I advised her that these medications have not been shown to be teratogenic and if she is having any issues off the medications she may  resume them.  I also recommended her to discuss this with her OB/GYN and/or psychiatrist going forward.    Patient discussed with Dr. Julieanne Cotton, DO [facility] Internal [facility] Resident PGY-1 Pager: [phone]

## 2023-02-16 NOTE — Patient Instructions (Addendum)
  Thank you, [name], for allowing [name] to provide your care today. Today we discussed . . .  > Pregnancy       -Congratulations on your pregnancy.  I have attached 2 copies of your test result.  I would recommend discussing your prescription medications with your OB and psychiatrist at your upcoming appointments and continue to hold them for now.  I am glad your CPAP is working at home and as we talked about I do recommend to continue this especially during pregnancy to help with her blood pressure.  If you have any questions or any need to be seen by [name] again please do not hesitate to call.    I have ordered the following labs for you:   Lab Orders         POCT Urine Pregnancy      Referrals ordered today:   Referral Orders  No referral(s) requested today      I have ordered the following medication/changed the following medications:   Stop the following medications: There are no discontinued medications.   Start the following medications: Meds ordered this encounter  Medications   loratadine (CLARITIN) 10 MG tablet    Sig: Take 1 tablet (10 mg total) by mouth daily.    Dispense:  30 tablet    Refill:  2      Follow up:  1 year     Remember:     Should you have any questions or concerns please call the internal medicine clinic at [phone].     Rocky Morel, DO [facility]

## 2023-02-17 ENCOUNTER — Encounter: Payer: Self-pay | Admitting: Student

## 2023-02-17 DIAGNOSIS — Z3490 Encounter for supervision of normal pregnancy, unspecified, unspecified trimester: Secondary | ICD-10-CM | POA: Insufficient documentation

## 2023-02-17 NOTE — Assessment & Plan Note (Signed)
Presented after a positive home pregnancy test and beta-hCG qualitative here was positive as well.  She plans to reestablish with her prior OB/GYN and will see them this week.  She had questions about the safety of atomoxetine, fluoxetine, and hydroxyzine during pregnancy.  She had stopped taking these whenever she thought she may be pregnant a few weeks ago.  I advised her that these medications have not been shown to be teratogenic and if she is having any issues off the medications she may resume them.  I also recommended her to discuss this with her OB/GYN and/or psychiatrist going forward.

## 2023-02-17 NOTE — Progress Notes (Signed)
Internal Medicine Clinic Attending  Case discussed with Dr. Goodwin  At the time of the visit.  We reviewed the resident's history and exam and pertinent patient test results.  I agree with the assessment, diagnosis, and plan of care documented in the resident's note.  

## 2023-03-15 ENCOUNTER — Encounter: Payer: Self-pay | Admitting: *Deleted

## 2023-03-15 DIAGNOSIS — Z6841 Body Mass Index (BMI) 40.0 and over, adult: Secondary | ICD-10-CM | POA: Insufficient documentation

## 2023-03-15 DIAGNOSIS — Z98891 History of uterine scar from previous surgery: Secondary | ICD-10-CM | POA: Insufficient documentation

## 2023-03-23 DIAGNOSIS — Z8632 Personal history of gestational diabetes: Secondary | ICD-10-CM | POA: Insufficient documentation

## 2023-03-23 DIAGNOSIS — F3342 Major depressive disorder, recurrent, in full remission: Secondary | ICD-10-CM | POA: Insufficient documentation

## 2023-09-12 DIAGNOSIS — D509 Iron deficiency anemia, unspecified: Secondary | ICD-10-CM | POA: Insufficient documentation

## 2024-02-01 ENCOUNTER — Ambulatory Visit: Payer: Self-pay | Admitting: Student

## 2024-02-01 VITALS — BP 135/72 | HR 64 | Temp 98.3°F | Ht 60.0 in | Wt 225.6 lb

## 2024-02-01 DIAGNOSIS — N764 Abscess of vulva: Secondary | ICD-10-CM | POA: Diagnosis not present

## 2024-02-01 DIAGNOSIS — L0291 Cutaneous abscess, unspecified: Secondary | ICD-10-CM | POA: Diagnosis present

## 2024-02-01 MED ORDER — SULFAMETHOXAZOLE-TRIMETHOPRIM 800-160 MG PO TABS: 1.0000 | ORAL_TABLET | Freq: Two times a day (BID) | ORAL | 0 refills | Status: AC

## 2024-02-01 NOTE — Progress Notes (Unsigned)
 Established Patient Office Visit  Subjective   Patient ID: Meghan Riggs, female    DOB: 11/17/1995  Age: 28 y.o. MRN: [id number]  Chief Complaint  Patient presents with   Recurrent Skin Infections    2nd time -boil on her panty line area, boil popped on its own having some drainage Yellowish blood/pus   Medication Problem    Prozac    Meghan Riggs is a 28 y.o. who presents to the clinic for an abscess on her left labia majora . Please see problem based assessment and plan for additional details.   Patient Active Problem List   Diagnosis Date Noted   Abscess of labia majora 02/02/2024   Pregnancy 02/17/2023   Astigmatism 03/18/2022   Intertrigo 03/07/2022   Obesity 03/07/2022   Hyperhidrosis 03/07/2022   Chronic gastritis 09/08/2021   Bleeding hemorrhoids 09/08/2021   Hematochezia 09/08/2021   Frequent bowel movements 09/08/2021   Cellulitis of left breast 09/08/2021   Cherry angioma 09/08/2021   Cannabis abuse 01/15/2021   Generalized anxiety disorder with panic attacks 01/15/2021   PTSD (post-traumatic stress disorder) 01/15/2021   Asthma 09/10/2018   Hyperlipidemia 09/10/2018   Detrusor sphincter dyssynergia 09/10/2018    Objective:     BP 135/72 (BP Location: Left Arm, Patient Position: Sitting, Cuff Size: Normal)   Pulse 64   Temp 98.3 F (36.8 C) (Oral)   Ht 5' (1.524 m)   Wt 225 lb 9.6 oz (102.3 kg)   SpO2 100%   BMI 44.06 kg/m  BP Readings from Last 3 Encounters:  02/01/24 135/72  02/16/23 112/67  08/01/22 131/83   Wt Readings from Last 3 Encounters:  02/01/24 225 lb 9.6 oz (102.3 kg)  02/16/23 234 lb 8 oz (106.4 kg)  08/01/22 225 lb 9.6 oz (102.3 kg)      Physical Exam Vitals reviewed. Exam conducted with a chaperone present.  Constitutional:      Appearance: She is obese.  Cardiovascular:     Rate and Rhythm: Normal rate and regular rhythm.     Heart sounds: Normal heart sounds. No murmur heard. Pulmonary:     Effort:  Pulmonary effort is normal. No respiratory distress.     Breath sounds: Normal breath sounds. No wheezing or rales.  Genitourinary:      Comments: Abscess with sinus tract present. Induration and fluctuation present.   Trace amounts of purulence present in the sinus tract.   Chaperones present throughout exams:  -Meghan Riggs -Meghan Riggs -Meghan Riggs  Musculoskeletal:     Right lower leg: No edema.     Left lower leg: No edema.  Skin:    General: Skin is warm and dry.  Neurological:     Mental Status: She is alert.  Psychiatric:        Mood and Affect: Mood and affect normal.        Behavior: Behavior normal.     Last CBC Lab Results  Component Value Date   WBC 10.9 (H) 09/08/2021   HGB 12.9 09/08/2021   HCT 37.4 09/08/2021   MCV 86 09/08/2021   MCH 29.5 09/08/2021   RDW 13.0 09/08/2021   PLT 288 09/08/2021   Last metabolic panel Lab Results  Component Value Date   GLUCOSE 86 02/27/2020   NA 136 02/27/2020   K 4.0 02/27/2020   CL 105 02/27/2020   CO2 19 (L) 02/27/2020   BUN <5 (L) 02/27/2020   CREATININE 0.52 02/27/2020   GFRNONAA >60 02/27/2020   CALCIUM  9.2 02/27/2020   PROT 7.4 09/23/2021   ALBUMIN 4.1 09/23/2021   BILITOT 0.4 09/23/2021   ALKPHOS 76 09/23/2021   AST 69 (H) 09/23/2021   ALT 64 (H) 09/23/2021   ANIONGAP 12 02/27/2020   Last lipids No results found for: "CHOL", "HDL", "LDLCALC", "LDLDIRECT", "TRIG", "CHOLHDL" Last hemoglobin A1c Lab Results  Component Value Date   HGBA1C 5.5 03/18/2022      The ASCVD Risk score (Arnett DK, et al., 2019) failed to calculate for the following reasons:   The 2019 ASCVD risk score is only valid for ages 42 to 21    Assessment & Plan:   Problem List Items Addressed This Visit       Genitourinary   Abscess of labia majora   Patient presents with an abscess located on her lateral left labia majora that she noticed four days prior. She reports that the abscess started as a red painful bump that started to  drain puss in blood the morning prior to her appointment. Her pain is currently a 7/10 and she denies fever/chills.  She reports that this is the first abscess that grew near her genitals, but she has had other abscess on her abdomen, breast, thighs, and back prior: all drained purulent material.   On exam with chaperone (Meghan Riggs and Meghan Riggs), there is an abscess with a sinus tract present with a small amount of purulent material in the sinus tract.  Plan: -Bactrim  BID for one week, patient denies current pregnancy and denies breast feeding.  -Dermatology referral for the reoccurring abscesses  -Patient was asked to follow up with gynecology if this abscess were to persist or reoccur       Other Visit Diagnoses       Abscess    -  Primary   Relevant Orders   Ambulatory referral to Dermatology       Return if symptoms worsen or fail to improve.    Meghan Lees, DO

## 2024-02-01 NOTE — Patient Instructions (Signed)
 Thank you, Ms.Shaunna [place] for allowing us  to provide your care today. Today we discussed the recurrent skin infections. Please take one pill twice a day for one week of the antibiotic we have ordered.  If the infection is still draining and not improving please call the gynecologist.  Please follow up with the dermatologist    Referrals ordered today:    Referral Orders         Ambulatory referral to Dermatology      I have ordered the following medication/changed the following medications:   Stop the following medications: There are no discontinued medications.   Start the following medications: Meds ordered this encounter  Medications   sulfamethoxazole -trimethoprim  (BACTRIM  DS) 800-160 MG tablet    Sig: Take 1 tablet by mouth 2 (two) times daily for 7 days.    Dispense:  14 tablet    Refill:  0     Follow up: as needed if the symptoms persist or worsen     Should you have any questions or concerns please call the internal medicine clinic at [phone].     Please note that our late policy has changed.  If you are more than 15 minutes late to your appointment, you may be asked to reschedule your appointment.  Dr. Sharlon Deacon, D.O. [facility]

## 2024-02-02 DIAGNOSIS — N764 Abscess of vulva: Secondary | ICD-10-CM | POA: Insufficient documentation

## 2024-02-02 NOTE — Assessment & Plan Note (Signed)
 Patient presents with an abscess located on her lateral left labia majora that she noticed four days prior. She reports that the abscess started as a red painful bump that started to drain puss in blood the morning prior to her appointment. Her pain is currently a 7/10 and she denies fever/chills.  She reports that this is the first abscess that grew near her genitals, but she has had other abscess on her abdomen, breast, thighs, and back prior: all drained purulent material.   On exam with chaperone (Darlene and Jada), there is an abscess with a sinus tract present with a small amount of purulent material in the sinus tract.  Plan: -Bactrim  BID for one week, patient denies current pregnancy and denies breast feeding.  -Dermatology referral for the reoccurring abscesses  -Patient was asked to follow up with gynecology if this abscess were to persist or reoccur

## 2024-02-04 NOTE — Progress Notes (Signed)
Internal Medicine Clinic Attending  I was physically present during the key portions of the resident provided service and participated in the medical decision making of patient's management care. I reviewed pertinent patient test results.  The assessment, diagnosis, and plan were formulated together and I agree with the documentation in the resident's note.  Narendra, Nischal, MD  

## 2024-02-06 ENCOUNTER — Telehealth: Payer: Self-pay | Admitting: Student

## 2024-02-06 NOTE — Telephone Encounter (Signed)
 Per the Pt's healthy Blue Website this following office is in next with the patient's ins.  Will  send pt's referral to another provider's office as [facility] has decided not to take the Medicaid healthy Blue Plan.  Copied from CRM [id number]. Topic: Referral - Status >> Feb 06, 2024 11:22 AM Brynn Caras wrote: Reason for CRM: [facility] confirmed this patient's insurance is OON and they will be unable to proceed with her referral placed on 02/01/2024.

## 2024-05-14 DIAGNOSIS — F1290 Cannabis use, unspecified, uncomplicated: Secondary | ICD-10-CM | POA: Insufficient documentation

## 2024-06-07 ENCOUNTER — Encounter: Payer: Self-pay | Admitting: Student

## 2024-06-21 ENCOUNTER — Telehealth: Payer: Self-pay

## 2024-06-21 ENCOUNTER — Other Ambulatory Visit: Payer: Self-pay

## 2024-06-21 ENCOUNTER — Ambulatory Visit: Payer: Self-pay | Admitting: Student

## 2024-06-21 ENCOUNTER — Encounter: Payer: Self-pay | Admitting: Student

## 2024-06-21 VITALS — BP 122/59 | HR 83 | Temp 97.7°F | Ht 60.0 in | Wt 226.6 lb

## 2024-06-21 DIAGNOSIS — E785 Hyperlipidemia, unspecified: Secondary | ICD-10-CM | POA: Diagnosis not present

## 2024-06-21 DIAGNOSIS — Z1159 Encounter for screening for other viral diseases: Secondary | ICD-10-CM | POA: Diagnosis not present

## 2024-06-21 DIAGNOSIS — G4733 Obstructive sleep apnea (adult) (pediatric): Secondary | ICD-10-CM | POA: Diagnosis not present

## 2024-06-21 DIAGNOSIS — D649 Anemia, unspecified: Secondary | ICD-10-CM

## 2024-06-21 DIAGNOSIS — J45909 Unspecified asthma, uncomplicated: Secondary | ICD-10-CM

## 2024-06-21 DIAGNOSIS — Z833 Family history of diabetes mellitus: Secondary | ICD-10-CM | POA: Diagnosis not present

## 2024-06-21 DIAGNOSIS — L0291 Cutaneous abscess, unspecified: Secondary | ICD-10-CM

## 2024-06-21 DIAGNOSIS — R1033 Periumbilical pain: Secondary | ICD-10-CM | POA: Diagnosis not present

## 2024-06-21 DIAGNOSIS — Z79899 Other long term (current) drug therapy: Secondary | ICD-10-CM | POA: Diagnosis not present

## 2024-06-21 DIAGNOSIS — Z6841 Body Mass Index (BMI) 40.0 and over, adult: Secondary | ICD-10-CM

## 2024-06-21 DIAGNOSIS — E66813 Obesity, class 3: Secondary | ICD-10-CM

## 2024-06-21 DIAGNOSIS — Z23 Encounter for immunization: Secondary | ICD-10-CM

## 2024-06-21 DIAGNOSIS — Z8249 Family history of ischemic heart disease and other diseases of the circulatory system: Secondary | ICD-10-CM

## 2024-06-21 DIAGNOSIS — R109 Unspecified abdominal pain: Secondary | ICD-10-CM

## 2024-06-21 MED ORDER — AIRSUPRA 90-80 MCG/ACT IN AERO: 1.0000 | INHALATION_SPRAY | Freq: Two times a day (BID) | RESPIRATORY_TRACT | 3 refills | Status: DC | PRN

## 2024-06-21 MED ORDER — BUDESONIDE-FORMOTEROL FUMARATE 80-4.5 MCG/ACT IN AERO: 2.0000 | INHALATION_SPRAY | Freq: Two times a day (BID) | RESPIRATORY_TRACT | 12 refills | Status: AC | PRN

## 2024-06-21 MED ORDER — TIRZEPATIDE-WEIGHT MANAGEMENT 2.5 MG/0.5ML ~~LOC~~ SOLN: 2.5000 mg | SUBCUTANEOUS | 1 refills | Status: DC

## 2024-06-21 NOTE — Progress Notes (Unsigned)
 Established Patient Office Visit  Subjective   Patient ID: Meghan Riggs, female    DOB: May 03, 1996  Age: 28 y.o. MRN: [id number]  Chief Complaint  Patient presents with   Follow-up    Meghan Riggs is a 28 y.o. who presents to the clinic for an annual visit where we discussed asthma, HLD, Obesity, and OSA. Please see problem based assessment and plan for additional details.   Patient Active Problem List   Diagnosis Date Noted   Nonspecific abdominal pain 06/22/2024   OSA (obstructive sleep apnea) 06/22/2024   Abscess of labia majora 02/02/2024   Astigmatism 03/18/2022   Intertrigo 03/07/2022   Obesity, morbid (HCC) 03/07/2022   Hyperhidrosis 03/07/2022   Chronic gastritis 09/08/2021   Bleeding hemorrhoids 09/08/2021   Hematochezia 09/08/2021   Frequent bowel movements 09/08/2021   Cellulitis of left breast 09/08/2021   Cherry angioma 09/08/2021   Cannabis abuse 01/15/2021   Generalized anxiety disorder with panic attacks 01/15/2021   PTSD (post-traumatic stress disorder) 01/15/2021   Asthma 09/10/2018   Hyperlipidemia 09/10/2018   Detrusor sphincter dyssynergia 09/10/2018      Objective:     BP (!) 122/59 (BP Location: Right Arm, Patient Position: Sitting, Cuff Size: Large)   Pulse 83   Temp 97.7 F (36.5 C) (Oral)   Ht 5' (1.524 m)   Wt 226 lb 9.6 oz (102.8 kg)   SpO2 99%   BMI 44.25 kg/m  BP Readings from Last 3 Encounters:  06/21/24 (!) 122/59  02/01/24 135/72  02/16/23 112/67   Wt Readings from Last 3 Encounters:  06/21/24 226 lb 9.6 oz (102.8 kg)  02/01/24 225 lb 9.6 oz (102.3 kg)  02/16/23 234 lb 8 oz (106.4 kg)      Physical Exam Vitals reviewed.  Constitutional:      General: She is not in acute distress.    Appearance: She is not ill-appearing, toxic-appearing or diaphoretic.  Cardiovascular:     Rate and Rhythm: Normal rate and regular rhythm.     Heart sounds: No murmur heard. Pulmonary:     Effort: Pulmonary effort is  normal. No respiratory distress.     Breath sounds: Normal breath sounds. No wheezing.  Abdominal:     General: Abdomen is protuberant.     Palpations: Abdomen is soft.     Tenderness: There is abdominal tenderness in the periumbilical area. There is no guarding.  Musculoskeletal:     Right lower leg: No edema.     Left lower leg: No edema.  Skin:    General: Skin is warm and dry.  Neurological:     Mental Status: She is alert.  Psychiatric:        Mood and Affect: Mood and affect normal.      Results for orders placed or performed in visit on 06/21/24  Lipid Profile  Result Value Ref Range   Cholesterol, Total 222 (H) 100 - 199 mg/dL   Triglycerides 773 (H) 0 - 149 mg/dL   HDL 34 (L) >60 mg/dL   VLDL Cholesterol Cal 41 (H) 5 - 40 mg/dL   LDL Chol Calc (NIH) 852 (H) 0 - 99 mg/dL   Chol/HDL Ratio 6.5 (H) 0.0 - 4.4 ratio  TSH  Result Value Ref Range   TSH 4.670 (H) 0.450 - 4.500 uIU/mL  Hepatitis C Ab reflex to Quant PCR  Result Value Ref Range   HCV Ab Non Reactive Non Reactive  Comprehensive metabolic panel with GFR  Result Value  Ref Range   Glucose 112 (H) 70 - 99 mg/dL   BUN 10 6 - 20 mg/dL   Creatinine, Ser 9.32 0.57 - 1.00 mg/dL   eGFR 878 >40 fO/fpw/8.26   BUN/Creatinine Ratio 15 9 - 23   Sodium 138 134 - 144 mmol/L   Potassium 4.3 3.5 - 5.2 mmol/L   Chloride 103 96 - 106 mmol/L   CO2 19 (L) 20 - 29 mmol/L   Calcium 9.6 8.7 - 10.2 mg/dL   Total Protein 7.3 6.0 - 8.5 g/dL   Albumin 4.4 4.0 - 5.0 g/dL   Globulin, Total 2.9 1.5 - 4.5 g/dL   Bilirubin Total 0.3 0.0 - 1.2 mg/dL   Alkaline Phosphatase 82 44 - 121 IU/L   AST 33 0 - 40 IU/L   ALT 36 (H) 0 - 32 IU/L  CBC no Diff  Result Value Ref Range   WBC 9.6 3.4 - 10.8 x10E3/uL   RBC 4.53 3.77 - 5.28 x10E6/uL   Hemoglobin 13.4 11.1 - 15.9 g/dL   Hematocrit 60.2 65.9 - 46.6 %   MCV 88 79 - 97 fL   MCH 29.6 26.6 - 33.0 pg   MCHC 33.8 31.5 - 35.7 g/dL   RDW 86.7 88.2 - 84.5 %   Platelets 331 150 - 450  x10E3/uL  Interpretation:  Result Value Ref Range   HCV Interp 1: Comment     Last metabolic panel Lab Results  Component Value Date   GLUCOSE 112 (H) 06/21/2024   NA 138 06/21/2024   K 4.3 06/21/2024   CL 103 06/21/2024   CO2 19 (L) 06/21/2024   BUN 10 06/21/2024   CREATININE 0.67 06/21/2024   GFRNONAA >60 02/27/2020   CALCIUM 9.6 06/21/2024   PROT 7.3 06/21/2024   ALBUMIN 4.4 06/21/2024   LABGLOB 2.9 06/21/2024   BILITOT 0.3 06/21/2024   ALKPHOS 82 06/21/2024   AST 33 06/21/2024   ALT 36 (H) 06/21/2024   ANIONGAP 12 02/27/2020   Last lipids Lab Results  Component Value Date   CHOL 222 (H) 06/21/2024   HDL 34 (L) 06/21/2024   LDLCALC 147 (H) 06/21/2024   TRIG 226 (H) 06/21/2024   CHOLHDL 6.5 (H) 06/21/2024   Last hemoglobin A1c Lab Results  Component Value Date   HGBA1C 5.5 03/18/2022      The ASCVD Risk score (Arnett DK, et al., 2019) failed to calculate for the following reasons:   The 2019 ASCVD risk score is only valid for ages 39 to 75    Assessment & Plan:   Problem List Items Addressed This Visit       Respiratory   Asthma   H/o asthma diagnosed in childhood. Her triggers for exacerbations are heat, exertion, humidity, and allergies. She denies SOB and wheezing today.  She reports using her albuterol  inhaler roughly 3 to 5 days a week. On exam, lungs are clear to auscultate bilaterally.  Plan: - PFTs ordered -Insurance would not cover Airsupra , Symbicort  ordered for as needed      Relevant Medications   budesonide -formoterol  (SYMBICORT ) 80-4.5 MCG/ACT inhaler   Other Relevant Orders   Pulmonary function test   Pneumococcal conjugate vaccine 20-valent (Prevnar 20)   OSA (obstructive sleep apnea)   Patient has a history of obstructive sleep apnea and is compliant with CPAP nightly.  Patient underwent sleep study in April 2023 with results of Patients overall AHI of 8.1 suggests mild sleep apnea.  Plan: -Continue nightly CPAP -Start  Zepbound  2.5 mg  weekly        Genitourinary   Abscess of labia majora     Other   Hyperlipidemia   Unable to find records of prior lipid profile.  With patient's obesity, will obtain lipid profile today.      Relevant Orders   Lipid Profile (Completed)   Obesity, morbid (HCC) - Primary   Patient presents with a BMI of 44.25.  Patient has tried to be more cautious of portions and has removed junk food from her diet and also exercises by playing outside with her children for an hour a day.  Patient is committed to weight loss by continuing to have a clean diet  and to start exercising regularly. Patient denies family and personal history of medullary thyroid cancer and also denies family and personal history of MEN type II. Patient is currently not pregnant nor lactating. Plan: -Continue current diet of healthy portions and limiting high fat/carb food and begin to exercise regularly  Patient's history of OSA, will start tirzepatide  at 2.5 mg weekly - Telehealth follow-up in 1 month to assess toleration of through his appetite dosing prior to increasing to 5 mg weekly -Patient has a family history of thyroid disease, not thyroid cancer.  With her obesity will test TSH and assess for hypothyroidism      Relevant Medications   tirzepatide  (ZEPBOUND ) 2.5 MG/0.5ML injection vial   Nonspecific abdominal pain   Patient reports a week history of nonspecific abdominal pain.  She reports that the pain is generally located periumbilical.  The pain has not worsened in this past week.  She denies vomiting, diarrhea, constipation, fever, chills, changes in bowel habits, changes in vaginal discharge, hematuria, dysuria, increased urinary frequency.  On exam there is pain to palpate periumbilical area.  No skin lesions appreciated.  Of note, patient had an appendectomy a few years ago.  Plan: - Without systemic signs of infection or additional symptoms, patient was asked to monitor the abdominal pain  and notify us  if it worsens.        Other Visit Diagnoses       Need for hepatitis C screening test       Relevant Orders   Hepatitis C Ab reflex to Quant PCR (Completed)     Need for pneumococcal 20-valent conjugate vaccination       Relevant Orders   Pneumococcal conjugate vaccine 20-valent (Prevnar 20)     Periumbilical abdominal pain       Relevant Orders   Comprehensive metabolic panel with GFR (Completed)     Anemia, unspecified type       Relevant Orders   CBC no Diff (Completed)     Encounter for immunization       Relevant Orders   Flu vaccine trivalent PF, 6mos and older(Flulaval,Afluria,Fluarix,Fluzone) (Completed)   Pneumococcal conjugate vaccine 20-valent (Completed)       Return in about 4 weeks (around 07/19/2024) for telehealth, discuss toleration of GLP-1.    Damien Lease, DO

## 2024-06-21 NOTE — Telephone Encounter (Signed)
 Rec'd PA request for patients Zepbound .   PA will require sleep study documentation.    Per medicaid:

## 2024-06-21 NOTE — Patient Instructions (Signed)
 Thank you, [name] for allowing us  to provide your care today. Today we discussed asthma, obesity, family history of thyroid disease, and checking labs.    I will call you with the results of the labs if they are abnormal  For your asthma, please start an inhaler called Airsupra .  This inhaler is used as needed, please use 2 puffs up to twice a day as needed.  It is very important that you get the pulmonary function testing completed so we know how to appropriately treat your asthma.  For the obesity, I started you on a medication called Zepbound .  You start off with a low dose which is 2.5 mg, this is a once a week injection.  Common side effects likely talked about her nausea, constipation.  We will do a telehealth visit in 1 month to see how you are tolerating this medication before we start the next higher dose.  For the abdominal discomfort that you are having, we have ordered some labs but please keep an eye on symptoms and see if anything makes it better versus worse.  Call us  if the pain gets much worse or if you start to have fevers or chills.  I have ordered the following labs for you:   Lab Orders         Lipid Profile         TSH         Hepatitis C Ab reflex to Quant PCR         Comprehensive metabolic panel with GFR         CBC no Diff      Referrals ordered today:    Referral Orders         Ambulatory referral to Dermatology      I have ordered the following medication/changed the following medications:   Stop the following medications: Medications Discontinued During This Encounter  Medication Reason   albuterol  (PROVENTIL  HFA;VENTOLIN  HFA) 108 (90 Base) MCG/ACT inhaler Change in therapy     Start the following medications: Meds ordered this encounter  Medications   Albuterol -Budesonide  (AIRSUPRA ) 90-80 MCG/ACT AERO    Sig: Inhale 1 puff into the lungs 2 (two) times daily as needed (for wheezing and shortness of breath).    Dispense:  10.7 g     Refill:  3   tirzepatide  (ZEPBOUND ) 2.5 MG/0.5ML injection vial    Sig: Inject 2.5 mg into the skin once a week.    Dispense:  2 mL    Refill:  1     Follow up: One month Televisit for discussion of Zepbound      Should you have any questions or concerns please call the internal medicine clinic at [phone].     Please note that our late policy has changed.  If you are more than 15 minutes late to your appointment, you may be asked to reschedule your appointment.  Dr. Kandis, D.O. [facility]

## 2024-06-21 NOTE — Telephone Encounter (Signed)
 PA rec'd for patients Airsupra     ADVAIR DISKUS, ADVAIR HFA, DULERA, & SYMBICORT  is preferred by the insurance.  If suggested medication is appropriate, Please send in a new RX and discontinue this one. If not, please advise as to why it's not appropriate so that we may request a Prior Authorization. Please note, some preferred medications may still require a PA.  If the suggested medications have not been trialed and there are no contraindications to their use, the PA will not be submitted, as it will not be approved.

## 2024-06-22 DIAGNOSIS — R109 Unspecified abdominal pain: Secondary | ICD-10-CM | POA: Insufficient documentation

## 2024-06-22 DIAGNOSIS — G4733 Obstructive sleep apnea (adult) (pediatric): Secondary | ICD-10-CM | POA: Insufficient documentation

## 2024-06-22 LAB — LIPID PANEL
Chol/HDL Ratio: 6.5 ratio — ABNORMAL HIGH (ref 0.0–4.4)
Cholesterol, Total: 222 mg/dL — ABNORMAL HIGH (ref 100–199)
HDL: 34 mg/dL — ABNORMAL LOW (ref >39)
LDL Chol Calc (NIH): 147 mg/dL — ABNORMAL HIGH (ref 0–99)
Triglycerides: 226 mg/dL — ABNORMAL HIGH (ref 0–149)
VLDL Cholesterol Cal: 41 mg/dL — ABNORMAL HIGH (ref 5–40)

## 2024-06-22 LAB — COMPREHENSIVE METABOLIC PANEL WITH GFR
ALT: 36 IU/L — ABNORMAL HIGH (ref 0–32)
AST: 33 IU/L (ref 0–40)
Albumin: 4.4 g/dL (ref 4.0–5.0)
Alkaline Phosphatase: 82 IU/L (ref 44–121)
BUN/Creatinine Ratio: 15 (ref 9–23)
BUN: 10 mg/dL (ref 6–20)
Bilirubin Total: 0.3 mg/dL (ref 0.0–1.2)
CO2: 19 mmol/L — ABNORMAL LOW (ref 20–29)
Calcium: 9.6 mg/dL (ref 8.7–10.2)
Chloride: 103 mmol/L (ref 96–106)
Creatinine, Ser: 0.67 mg/dL (ref 0.57–1.00)
Globulin, Total: 2.9 g/dL (ref 1.5–4.5)
Glucose: 112 mg/dL — ABNORMAL HIGH (ref 70–99)
Potassium: 4.3 mmol/L (ref 3.5–5.2)
Sodium: 138 mmol/L (ref 134–144)
Total Protein: 7.3 g/dL (ref 6.0–8.5)
eGFR: 121 mL/min/1.73 (ref >59)

## 2024-06-22 LAB — CBC
Hematocrit: 39.7 % (ref 34.0–46.6)
Hemoglobin: 13.4 g/dL (ref 11.1–15.9)
MCH: 29.6 pg (ref 26.6–33.0)
MCHC: 33.8 g/dL (ref 31.5–35.7)
MCV: 88 fL (ref 79–97)
Platelets: 331 x10E3/uL (ref 150–450)
RBC: 4.53 x10E6/uL (ref 3.77–5.28)
RDW: 13.2 % (ref 11.7–15.4)
WBC: 9.6 x10E3/uL (ref 3.4–10.8)

## 2024-06-22 LAB — HCV AB W REFLEX TO QUANT PCR: HCV Ab: NONREACTIVE

## 2024-06-22 LAB — HCV INTERPRETATION

## 2024-06-22 LAB — TSH: TSH: 4.67 u[IU]/mL — ABNORMAL HIGH (ref 0.450–4.500)

## 2024-06-22 NOTE — Assessment & Plan Note (Signed)
 Patient has a history of obstructive sleep apnea and is compliant with CPAP nightly.  Patient underwent sleep study in April 2023 with results of Patients overall AHI of 8.1 suggests mild sleep apnea.  Plan: -Continue nightly CPAP -Start Zepbound  2.5 mg weekly

## 2024-06-22 NOTE — Assessment & Plan Note (Signed)
 Unable to find records of prior lipid profile.  With patient's obesity, will obtain lipid profile today.

## 2024-06-22 NOTE — Assessment & Plan Note (Signed)
 Patient reports a week history of nonspecific abdominal pain.  She reports that the pain is generally located periumbilical.  The pain has not worsened in this past week.  She denies vomiting, diarrhea, constipation, fever, chills, changes in bowel habits, changes in vaginal discharge, hematuria, dysuria, increased urinary frequency.  On exam there is pain to palpate periumbilical area.  No skin lesions appreciated.  Of note, patient had an appendectomy a few years ago.  Plan: - Without systemic signs of infection or additional symptoms, patient was asked to monitor the abdominal pain and notify us  if it worsens.

## 2024-06-22 NOTE — Assessment & Plan Note (Signed)
 Patient presents with a BMI of 44.25.  Patient has tried to be more cautious of portions and has removed junk food from her diet and also exercises by playing outside with her children for an hour a day.  Patient is committed to weight loss by continuing to have a clean diet  and to start exercising regularly. Patient denies family and personal history of medullary thyroid cancer and also denies family and personal history of MEN type II. Patient is currently not pregnant nor lactating. Plan: -Continue current diet of healthy portions and limiting high fat/carb food and begin to exercise regularly  Patient's history of OSA, will start tirzepatide  at 2.5 mg weekly - Telehealth follow-up in 1 month to assess toleration of through his appetite dosing prior to increasing to 5 mg weekly -Patient has a family history of thyroid disease, not thyroid cancer.  With her obesity will test TSH and assess for hypothyroidism

## 2024-06-22 NOTE — Assessment & Plan Note (Signed)
 H/o asthma diagnosed in childhood. Her triggers for exacerbations are heat, exertion, humidity, and allergies. She denies SOB and wheezing today.  She reports using her albuterol  inhaler roughly 3 to 5 days a week. On exam, lungs are clear to auscultate bilaterally.  Plan: - PFTs ordered -Insurance would not cover Airsupra , Symbicort  ordered for as needed

## 2024-06-24 ENCOUNTER — Ambulatory Visit: Payer: Self-pay | Admitting: Student

## 2024-06-24 DIAGNOSIS — R7989 Other specified abnormal findings of blood chemistry: Secondary | ICD-10-CM

## 2024-06-25 NOTE — Progress Notes (Signed)
 Internal Medicine Clinic Attending  Case discussed with the resident at the time of the visit.  We reviewed the resident's history and exam and pertinent patient test results.  I agree with the assessment, diagnosis, and plan of care documented in the resident's note.

## 2024-06-26 LAB — SPECIMEN STATUS REPORT

## 2024-06-26 LAB — T4, FREE: Free T4: 0.89 ng/dL (ref 0.82–1.77)

## 2024-07-01 ENCOUNTER — Telehealth: Payer: Self-pay | Admitting: *Deleted

## 2024-07-01 NOTE — Telephone Encounter (Signed)
 I called pt back, and pt states she already spoke with someone from [facility] office about signing medical records released form. Advised pt to call back if she needs further assistance.

## 2024-07-01 NOTE — Telephone Encounter (Signed)
 RTC to patient asking how she can get the results of a sleep study that was dome in [place].  Patient was asked to either go to the place she had the Sleep Study done or come to the [facility] to sign a request for the records.   Copied from CRM [id number]. Topic: General - Other >> Jun 28, 2024  1:47 PM Suzette B wrote: Reason for CRM: patient was speaking about the release of a sleep study, never got a chance to complete her conversation she said I'm sorry and hung the phone up >> Jun 28, 2024  1:53 PM Miquel SAILOR wrote: Patient requesting document realease due to weight loss medication to be filled.  Needs office to call back on how to set this in motion. [phone]

## 2024-07-08 ENCOUNTER — Ambulatory Visit (INDEPENDENT_AMBULATORY_CARE_PROVIDER_SITE_OTHER): Admitting: Student

## 2024-07-08 ENCOUNTER — Ambulatory Visit: Payer: Self-pay

## 2024-07-08 ENCOUNTER — Encounter: Payer: Self-pay | Admitting: Student

## 2024-07-08 VITALS — BP 122/82 | HR 68 | Temp 97.7°F | Ht 60.0 in | Wt 226.6 lb

## 2024-07-08 DIAGNOSIS — G4733 Obstructive sleep apnea (adult) (pediatric): Secondary | ICD-10-CM

## 2024-07-08 DIAGNOSIS — R197 Diarrhea, unspecified: Secondary | ICD-10-CM | POA: Diagnosis present

## 2024-07-08 DIAGNOSIS — Z79899 Other long term (current) drug therapy: Secondary | ICD-10-CM

## 2024-07-08 DIAGNOSIS — R52 Pain, unspecified: Secondary | ICD-10-CM

## 2024-07-08 DIAGNOSIS — R110 Nausea: Secondary | ICD-10-CM | POA: Diagnosis not present

## 2024-07-08 DIAGNOSIS — Z87891 Personal history of nicotine dependence: Secondary | ICD-10-CM

## 2024-07-08 MED ORDER — PROCHLORPERAZINE MALEATE 5 MG PO TABS: 5.0000 mg | ORAL_TABLET | Freq: Three times a day (TID) | ORAL | 0 refills | Status: DC | PRN

## 2024-07-08 NOTE — Progress Notes (Addendum)
 CC: Acute visit  HPI: [name] is a 28 y.o. female living with a history stated below and presents today for acute visit. Please see problem based assessment and plan for additional details.  Past Medical History:  Diagnosis Date   Acute otitis media 12/03/2021   Anemia    Anxiety    Arthritis    Asthma    Dr. 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   Depression    DM (diabetes mellitus) (HCC)    Gastritis    GERD (gastroesophageal reflux disease)    HTN (hypertension)    Hyperlipidemia    Internal hemorrhoids    Osteoarthritis    Viral URI with cough 11/22/2021    Current Outpatient Medications on File Prior to Visit  Medication Sig Dispense Refill   aluminum  chloride (DRYSOL) 20 % external solution Apply topically at bedtime. 35 mL 2   atomoxetine (STRATTERA) 40 MG capsule Take 40 mg by mouth daily.     budesonide -formoterol  (SYMBICORT ) 80-4.5 MCG/ACT inhaler Inhale 2 puffs into the lungs 2 (two) times daily as needed (For wheezing or Shortness of breath). 1 each 12   FLUoxetine (PROZAC) 40 MG capsule Take 40 mg by mouth daily.     hydrOXYzine  (ATARAX ) 10 MG tablet Take 10 mg by mouth 3 (three) times daily.     loratadine  (CLARITIN ) 10 MG tablet Take 1 tablet (10 mg total) by mouth daily. 30 tablet 2   nystatin  powder Apply 1 application. topically 3 (three) times daily. 15 g 0   omeprazole  (PRILOSEC) 40 MG capsule Take one capsule by mouth each morning and at bedtime. PLEASE SCHEDULE A FOLLOW UP APPOINTMENT FOR FURTHER REFILLS. Thank you 60 capsule 1   sucralfate  (CARAFATE ) 1 GM/10ML suspension Take 10 mLs (1 g total) by mouth 4 (four) times daily. 420 mL 1   tirzepatide  (ZEPBOUND ) 2.5 MG/0.5ML injection vial Inject 2.5 mg into the skin once a week. 2 mL 1   No current facility-administered medications on file prior to visit.    Family History  Problem Relation Age of Onset   COPD Mother    Asthma Mother    Cervical cancer Mother     Interstitial cystitis Mother    Yvone' disease Mother    Diabetes Father    Brain cancer Father    Heart failure Father    Club foot Brother    Diabetes Paternal Uncle    COPD Maternal Grandmother    Asthma Maternal Grandmother    Cirrhosis Maternal Grandfather    Diabetes Paternal Grandmother    Breast cancer Paternal Grandmother    Heart disease Paternal Grandfather    Colon cancer Neg Hx    Colon polyps Neg Hx    Esophageal cancer Neg Hx    Rectal cancer Neg Hx    Stomach cancer Neg Hx     Social History   Socioeconomic History   Marital status: Significant Other    Spouse name: Not on file   Number of children: 1   Years of education: Not on file   Highest education level: Associate degree: occupational, Scientist, product/process development, or vocational program  Occupational History   Occupation: SAHM  Tobacco Use   Smoking status: Former    Current packs/day: 0.00    Types: Cigarettes    Quit date: 01/24/2022    Years since quitting: 2.4   Smokeless tobacco: Never   Tobacco comments:    1  Social sometimes   Vaping Use   Vaping status: Never Used  Substance and Sexual Activity   Alcohol use: Yes    Comment: seldom   Drug use: No    Comment: hx marijuana use per [facility] record 01/29/20   Sexual activity: Yes    Birth control/protection: Inserts, None  Other Topics Concern   Not on file  Social History Narrative   Not on file   Social Drivers of Health   Financial Resource Strain: Medium Risk (02/26/2024)   Received from [facility]   Overall Financial Resource Strain (CARDIA)    Difficulty of Paying Living Expenses: Somewhat hard  Food Insecurity: No Food Insecurity (02/26/2024)   Received from [facility]   Hunger Vital Sign    Within the past 12 months, you worried that your food would run out before you got the money to buy more.: Never true    Within the past 12 months, the food you bought just didn't last and you didn't have money to get more.: Never true   Transportation Needs: No Transportation Needs (02/26/2024)   Received from [facility] - Transportation    Lack of Transportation (Medical): No    Lack of Transportation (Non-Medical): No  Physical Activity: Insufficiently Active (02/01/2024)   Exercise Vital Sign    Days of Exercise per Week: 3 days    Minutes of Exercise per Session: 30 min  Stress: Stress Concern Present (02/26/2024)   Received from [facility] - Occupational Stress Questionnaire    Feeling of Stress : To some extent  Social Connections: Socially Integrated (02/26/2024)   Received from [facility]   Social Network    How would you rate your social network (family, work, friends)?: Good participation with social networks  Recent Concern: Social Connections - Moderately Isolated (02/01/2024)   Social Connection and Isolation Panel    Frequency of Communication with Friends and Family: More than three times a week    Frequency of Social Gatherings with Friends and Family: More than three times a week    Attends Religious Services: Never    Database administrator or Organizations: No    Attends Engineer, structural: Not on file    Marital Status: Living with partner  Intimate Partner Violence: Not At Risk (02/26/2024)   Received from [facility]   HITS    Over the last 12 months how often did your partner physically hurt you?: Never    Over the last 12 months how often did your partner insult you or talk down to you?: Never    Over the last 12 months how often did your partner threaten you with physical harm?: Never    Over the last 12 months how often did your partner scream or curse at you?: Never    Review of Systems: ROS negative except for what is noted on the assessment and plan.  Vitals:   07/08/24 1406  BP: 122/82  Pulse: 68  Temp: 97.7 F (36.5 C)  TempSrc: Oral  SpO2: 96%  Weight: 226 lb 9.6 oz (102.8 kg)  Height: 5' (1.524 m)    Physical Exam: Constitutional: Alert, sitting up in chair, in no acute distress HENT: noted 2 pea-sized excoriations on scalp near frontal hairline without surrounding erythema, drainage or signs of infection  Cardiovascular: regular rate and rhythm Pulmonary/Chest: normal work of breathing on room air, lungs clear to auscultation bilaterally Abdominal: Bowel sounds  present, soft, nontender nondistended Neurological: alert & oriented x 3   Assessment & Plan:   Assessment & Plan Diarrhea, unspecified type Body aches Nausea Presents for an acute visit today with 1 day of diarrhea, nausea and headache.  Reports nonbloody watery diarrhea with up to 4 episodes today.  Generalized headache that reports around 6-7/10.  Denies vision changes.  Unclear of sick contacts but did recently attend a children's birthday party the day before.  Endorses body aches and 1 episode of vomiting.  Tolerating p.o. intake.  Denies fever, abdominal pain.  Recently received flu and PCV vaccination on 9/12.  Vitals are HDS, afebrile.  Exam unremarkable today.  Suspect viral etiology possibly gastroenteritis or food poisoning given recent party with possible sick contacts.  She appears hemodynamically stable so we will continue with supportive care and monitoring of symptoms.  Return precautions discussed.  Plan -Swab for Covid, Flu, RSV -Discussed adequate hydration at home -Compazine Q8H PRN for headache/nausea -F/u in 1-2 weeks via telehealth or in-person for symptom check -Return precautions discussed for UC/ED OSA (obstructive sleep apnea) Hx of OSA and obesity with BMI 44. Adherent with CPAP nightly. Patient is working on lifestyle modifications including healthy eating habits such as dietary changes and increasing physical activity as tolerated.   Plan -Continue CPAP and Rx sent for Zepbound  2.5 mg weekly.  -Continue lifestyle modifications with nutrition and physical activity.    Return in about 1 week  (around 07/15/2024) for follow up (telehealth or in-person).   Patient discussed with Dr. Karna Ozell Nearing, D.O. [facility], PGY-3 Phone: [phone] Date 07/08/2024 Time 8:15 PM

## 2024-07-08 NOTE — Telephone Encounter (Signed)
 FYI Only or Action Required?: FYI only for provider.  Patient was last seen in primary care on 06/21/2024 by Kandis Perkins, DO.  Called Nurse Triage reporting Headache. Light sensitive, Chills, Nausea and diarrhea.  Symptoms began yesterday.  Interventions attempted: OTC medications: IBU.  Symptoms are: unchanged.  Triage Disposition: See HCP Within 4 Hours (Or PCP Triage)  Patient/caregiver understands and will follow disposition?: Yes                     Copied from CRM #[id number]. Topic: Clinical - Red Word Triage >> Jul 08, 2024  9:26 AM Meghan Riggs wrote: Red Word that prompted transfer to Nurse Triage: severe headache, so bad that she cannot keep her eyes open. Also experiencing severe diarrhea and nausea. also has a skin picking problem, has two sores at the top of her head, not sure if that has anything to with what she's experiencing. Reason for Disposition  [1] SEVERE headache (e.g., excruciating) AND [2] not improved after 2 hours of pain medicine  Answer Assessment - Initial Assessment Questions 1. LOCATION: Where does it hurt?      Front  - mask  - top of head and temples 2. ONSET: When did the headache start? (e.g., minutes, hours, days)      Last night 3. PATTERN: Does the pain come and go, or has it been constant since it started?     contestant 4. SEVERITY: How bad is the pain? and What does it keep you from doing?  (e.g., Scale 1-10; mild, moderate, or severe)     8/10 5. RECURRENT SYMPTOM: Have you ever had headaches before? If Yes, ask: When was the last time? and What happened that time?      yes 6. CAUSE: What do you think is causing the headache?     Went to a birthday party 7. MIGRAINE: Have you been diagnosed with migraine headaches? If Yes, ask: Is this headache similar?      No 8. HEAD INJURY: Has there been any recent injury to your head?      no 9. OTHER SYMPTOMS: Do you have any other symptoms? (e.g., fever,  stiff neck, eye pain, sore throat, cold symptoms)     Diarrhea, can't keep eyes open, Nausea and diarrhea also 2 wounds on head possibly from picking 10. PREGNANCY: Is there any chance you are pregnant? When was your last menstrual period?       no  Protocols used: Headache-A-AH

## 2024-07-08 NOTE — Patient Instructions (Signed)
 Thank you, Meghan Riggs for allowing us  to provide your care today. Today we discussed:  -Swab for Covid, Flu and RSV -Stay hydrated at home! -Take tylenol  as needed for pain -Try compazine every 8 hours as needed for headache and nausea/vomiting -Check back in 1-2 weeks  -If symptoms worsen and develop shortness of breath, fevers or further distress. Please go to the urgent care/emergency room for further evaluation.   I have ordered the following medication/changed the following medications:  Start the following medications: Meds ordered this encounter  Medications   prochlorperazine (COMPAZINE) 5 MG tablet    Sig: Take 1 tablet (5 mg total) by mouth every 8 (eight) hours as needed for nausea or vomiting.    Dispense:  15 tablet    Refill:  0     Follow up: 1-2 weeks for telehealth or in-person   Should you have any questions or concerns please call the internal medicine clinic at [phone].    Cage Gupton, D.O. [facility]

## 2024-07-08 NOTE — Telephone Encounter (Signed)
 Pt has an appt this afternoon w/Dr Elicia.

## 2024-07-09 ENCOUNTER — Encounter: Payer: Self-pay | Admitting: Student

## 2024-07-09 NOTE — Progress Notes (Signed)
 Internal Medicine Clinic Attending  Case discussed with the resident at the time of the visit.  We reviewed the resident's history and exam and pertinent patient test results.  I agree with the assessment, diagnosis, and plan of care documented in the resident's note.

## 2024-07-11 ENCOUNTER — Ambulatory Visit: Payer: Self-pay | Admitting: Student

## 2024-07-11 LAB — COVID-19, FLU A+B AND RSV
Influenza A, NAA: NOT DETECTED
Influenza B, NAA: NOT DETECTED
RSV, NAA: NOT DETECTED
SARS-CoV-2, NAA: NOT DETECTED

## 2024-07-22 ENCOUNTER — Telehealth: Payer: Self-pay | Admitting: *Deleted

## 2024-07-22 NOTE — Telephone Encounter (Signed)
 I contacted pt to let her know that we did received the sleep study notes. Notes were given to Jada for prior authorization on one of her medication. Advised pt to call back if she needs further assistance.

## 2024-07-22 NOTE — Telephone Encounter (Signed)
 Will forward to [facility] to see if information has arrived.  Copied from CRM [id number]. Topic: General - Other >> Jun 28, 2024  1:47 PM Suzette B wrote: Reason for CRM: patient was speaking about the release of a sleep study, never got a chance to complete her conversation she said I'm sorry and hung the phone up >> Jul 22, 2024 11:54 AM Graeme W wrote: Patient called to check status. States she cam in and signed a release. Wanted to know if there was any update. Is trying to get medication approved. Thank You  >> Jun 28, 2024  1:53 PM Miquel SAILOR wrote: Patient requesting document realease due to weight loss medication to be filled.  Needs office to call back on how to set this in motion. [phone]

## 2024-07-23 ENCOUNTER — Telehealth: Payer: Self-pay

## 2024-07-23 NOTE — Telephone Encounter (Addendum)
 Hey Dr.D'Mello this request was submitted with a diagnosis of obesity as it is written on the rx Dr.Bender sent in. I will resubmit with a diagnosis of OSA.

## 2024-07-23 NOTE — Telephone Encounter (Signed)
 Prior Authorization for patient (Zepbound  2.5MG /0.5ML pen-injectors) came through on cover my meds was submitted with last office notes awaiting approval or denial.  XZB:[id number]

## 2024-07-23 NOTE — Telephone Encounter (Signed)
 Date: 07/22/2024 Request #: [id number] Physician name: Damien Lease Office fax: [phone] Member name: Jaeley Wiker Member ID: [id number] Member DOB: [birth year] Drug requested: ZEPBOUND  2.5 MG/0.5 ML PEN Notes: This is a courtesy notification to advise you that we do not cover the above-requested  medication under this member's benefit plan.  Please note, a copy of the denial letter that we sent to the member containing the appeal  and/or grievance process will be mailed to your office. The member was sent a Notice of  Action regarding this denial. If you have member benefit questions, please call the Provider Services team at  [phone]. For more information about our pharmacy program and to see the Preferred  Drug List, visit [email or web address]. Thank you, [facility]

## 2024-07-23 NOTE — Assessment & Plan Note (Addendum)
 Hx of OSA and obesity with BMI 44. Adherent with CPAP nightly. Patient is working on lifestyle modifications including healthy eating habits such as dietary changes and increasing physical activity as tolerated.   Plan -Continue CPAP and Rx sent for Zepbound  2.5 mg weekly.  -Continue lifestyle modifications with nutrition and physical activity.

## 2024-07-23 NOTE — Telephone Encounter (Signed)
 Date: 07/23/2024 To: Dr. Damien Lease Fax number: [phone] Subject: Pharmacy prior authorization request number [id number] Regarding member: [id number]; Shahrzad Koble; Apr 14, 1996 Dear Dr. Damien Lease: [employer] reviewed your ZEPBOUND  2.5 MG/0.5 ML PEN request for the above-identified  member, and it is denied for the following reason: because we did not see what we need to  approve the drug you asked for, (Zepbound ). We may be able to approve this drug in a certain  situation (when you are currently on and will continue lifestyle modification including structured  nutrition and physical activity). We do not see that this applies to you. If this applies to you, we  may need more information (if you will use the requested drug with another similar drug; if  there are reasons you should not use the requested drug [Food and Drug Administrationlabeled contraindications]; records that a certain type of test [sleep apnea testing] confirms the  diagnosis; if you have been taught ways to improve your sleep habits). We based this decision  on your health plan's prior authorization clinical criteria named GLP1s (glucagon-like peptide  1s) Wegovy and Zepbound .   Dr.Zheng can you addend your note and add the following information thanks!

## 2024-07-23 NOTE — Telephone Encounter (Signed)
 Prior Authorization has been resubmitted.  [id number]

## 2024-07-24 NOTE — Telephone Encounter (Signed)
 Thank you, prior authorization has been resubmitted awaiting approval or denial.  XZB:[id number]

## 2024-07-29 ENCOUNTER — Encounter: Payer: Self-pay | Admitting: Student

## 2024-07-29 ENCOUNTER — Telehealth: Admitting: Student

## 2024-07-29 DIAGNOSIS — G4733 Obstructive sleep apnea (adult) (pediatric): Secondary | ICD-10-CM

## 2024-07-29 NOTE — Assessment & Plan Note (Addendum)
 Pending resubmission of PA for Zepbound .  Provided sleep study documentation.  Patient continues to be working on lifestyle modifications including eating healthy and increasing physical activity. If denied, will consider appeal given indications for Zepbound  for her obesity and OSA. We discussed referral to [facility] weight and wellness if denied.  Patient will follow-up if she is interested.  We also discussed other weight loss options including orlistat but deferred at this time. No charge today.

## 2024-07-29 NOTE — Telephone Encounter (Signed)
 I called the patients insurance for the status of the appeal. Per Dania the pa has been denied. Sleep study document have been faxed for the 3rd time. All confirmations went through per [facility]. Patient is aware of appeal status.

## 2024-07-29 NOTE — Progress Notes (Signed)
 Virtual Visit via Video Note  I connected with Meghan Riggs on 07/29/24 at  1:15 PM EDT by a video enabled telemedicine application and verified that I am speaking with the correct person using two identifiers.  Patient Location: Home Provider Location: Office/Clinic  I discussed the limitations, risks, security, and privacy concerns of performing an evaluation and management service by video and the availability of in person appointments. I also discussed with the patient that there may be a patient responsible charge related to this service. The patient expressed understanding and agreed to proceed.  Subjective: PCP: Kandis Perkins, DO  No chief complaint on file.  Telehealth visit to discuss Zepbound .  Initial PA denied.  PA faxed with sleep study documentation, unclear of decision at this time.  Last BMI of 44 at LOV.  OSA with CPAP compliance.  Patient states she is working on her daily physical activity and trying hard with lifestyle modifications as best as tolerated.  No acute concerns today.  Patient reports resolution of symptoms from LOV acute visit.   ROS: Per HPI  Current Outpatient Medications:    aluminum  chloride (DRYSOL) 20 % external solution, Apply topically at bedtime., Disp: 35 mL, Rfl: 2   atomoxetine (STRATTERA) 40 MG capsule, Take 40 mg by mouth daily., Disp: , Rfl:    budesonide -formoterol  (SYMBICORT ) 80-4.5 MCG/ACT inhaler, Inhale 2 puffs into the lungs 2 (two) times daily as needed (For wheezing or Shortness of breath)., Disp: 1 each, Rfl: 12   FLUoxetine (PROZAC) 40 MG capsule, Take 40 mg by mouth daily., Disp: , Rfl:    hydrOXYzine  (ATARAX ) 10 MG tablet, Take 10 mg by mouth 3 (three) times daily., Disp: , Rfl:    loratadine  (CLARITIN ) 10 MG tablet, Take 1 tablet (10 mg total) by mouth daily., Disp: 30 tablet, Rfl: 2   nystatin  powder, Apply 1 application. topically 3 (three) times daily., Disp: 15 g, Rfl: 0   omeprazole  (PRILOSEC) 40 MG capsule, Take  one capsule by mouth each morning and at bedtime. PLEASE SCHEDULE A FOLLOW UP APPOINTMENT FOR FURTHER REFILLS. Thank you, Disp: 60 capsule, Rfl: 1   prochlorperazine (COMPAZINE) 5 MG tablet, Take 1 tablet (5 mg total) by mouth every 8 (eight) hours as needed for nausea or vomiting., Disp: 15 tablet, Rfl: 0   sucralfate  (CARAFATE ) 1 GM/10ML suspension, Take 10 mLs (1 g total) by mouth 4 (four) times daily., Disp: 420 mL, Rfl: 1   tirzepatide  (ZEPBOUND ) 2.5 MG/0.5ML injection vial, Inject 2.5 mg into the skin once a week., Disp: 2 mL, Rfl: 1  Observations/Objective: There were no vitals filed for this visit. Physical Exam Constitutional:      General: She is not in acute distress.    Appearance: Normal appearance. She is not ill-appearing.  Neurological:     Mental Status: She is alert.     Assessment and Plan: Assessment & Plan OSA (obstructive sleep apnea) Pending resubmission of PA for Zepbound .  Provided sleep study documentation.  Patient continues to be working on lifestyle modifications including eating healthy and increasing physical activity. If denied, will consider appeal given indications for Zepbound  for her obesity and OSA. We discussed referral to [facility] weight and wellness if denied.  Patient will follow-up if she is interested.  We also discussed other weight loss options including orlistat but deferred at this time. No charge today.       Follow Up Instructions: Return in about 3 months (around 10/29/2024) for routine visit .  The patient was advised to call back or seek an in-person evaluation if the symptoms worsen or if the condition fails to improve as anticipated.    Patient discussed with Dr. Hoffman  [name], DO

## 2024-08-01 NOTE — Progress Notes (Signed)
 Internal Medicine Clinic Attending  Case discussed with the resident at the time of the visit.  We reviewed the resident's history and exam and pertinent patient test results.  I agree with the assessment, diagnosis, and plan of care documented in the resident's note.

## 2024-09-03 ENCOUNTER — Ambulatory Visit: Payer: Self-pay | Admitting: *Deleted

## 2024-09-03 ENCOUNTER — Encounter: Payer: Self-pay | Admitting: Student

## 2024-09-03 ENCOUNTER — Other Ambulatory Visit: Payer: Self-pay

## 2024-09-03 ENCOUNTER — Ambulatory Visit: Admitting: Student

## 2024-09-03 VITALS — BP 125/82 | HR 80 | Temp 98.6°F | Wt 224.8 lb

## 2024-09-03 DIAGNOSIS — R220 Localized swelling, mass and lump, head: Secondary | ICD-10-CM | POA: Diagnosis present

## 2024-09-03 DIAGNOSIS — L304 Erythema intertrigo: Secondary | ICD-10-CM

## 2024-09-03 MED ORDER — NYSTATIN 100000 UNIT/GM EX POWD: 1.0000 | Freq: Three times a day (TID) | CUTANEOUS | 0 refills | Status: AC

## 2024-09-03 NOTE — Telephone Encounter (Signed)
 Pt has an appt today 11/25 with Dr Norrine.

## 2024-09-03 NOTE — Telephone Encounter (Signed)
 FYI Only or Action Required?: FYI only for provider: appointment scheduled on 11/25.  Patient was last seen in primary care on 07/29/2024 by Elicia Sharper, DO.  Called Nurse Triage reporting Cyst.  Symptoms began several days ago.  Interventions attempted: Nothing.  Symptoms are: unchanged.  Triage Disposition: See Physician Within 24 Hours  Patient/caregiver understands and will follow disposition?: Yes  Copied from CRM #[id number]. Topic: Clinical - Red Word Triage >> Sep 03, 2024  8:34 AM Cherylann RAMAN wrote: Red Word that prompted transfer to Nurse Triage: Patient called in with complaints of a knot on the left side of her face. The knot is located where the jaw line meets the ear. The knot is hard like a brick and is causing her face to swell and hurt. In addition, she states that she is just getting over a stye and unsure if the two are related. Reason for Disposition  [1] Swelling is painful to touch AND [2] no fever  Answer Assessment - Initial Assessment Questions 1. APPEARANCE of SWELLING: What does it look like?     Surrounding area- knot in under the skin 2. SIZE: How large is the swelling? (e.g., inches, cm; or compare to size of pinhead, tip of pen, eraser, coin, pea, grape, ping pong ball)      Large marble size 3. LOCATION: Where is the swelling located?     Left side of face-between jaw and ear 4. ONSET: When did the swelling start?     Noticed- Sunday 5. COLOR: What color is it? Is there more than one color?     No discoloration 6. PAIN: Is there any pain? If Yes, ask: How bad is the pain? (Scale 1-10; or mild, moderate, severe)       Jaw is sore, 7/10 7. ITCH: Does it itch? If Yes, ask: How bad is the itch?      no 8. CAUSE: What do you think caused the swelling?     Unsure- recent stye 9 OTHER SYMPTOMS: Do you have any other symptoms? (e.g., fever)     Patient feels hot- sweating  Protocols used: Skin Lump or Localized Swelling-A-AH

## 2024-09-03 NOTE — Assessment & Plan Note (Addendum)
 Chronic, with recurrence of rash under breasts. Recommend drying area thoroughly after bathing with hair dryer on low. Will refill nystatin  powder which has worked for her in the past. Orders:   nystatin  powder; Apply 1 Application topically 3 (three) times daily.

## 2024-09-03 NOTE — Progress Notes (Signed)
 Patient name: Meghan Riggs Date of birth: [birth year] Date of visit: 09/03/24  Subjective  Reason for visit: Acute Visit (PER E2C2 ;// ( Left ) side of face knot under skin PT states having pain that has been going on since Sunday. ) and Medication Refill (PT needs a medication refill./)  Discussed the use of AI scribe software for clinical note transcription with the patient, who gave verbal consent to proceed.  History of Present Illness   Meghan Riggs is a 28 year old female who presents with a painful swelling on the left side of her face.  Facial swelling and pain - Painful swelling on the left side of the face, onset Sunday as a marble-sized knot - Swelling has increased in size and pain radiates down the face - Impaired movement of facial muscles on the left side - Numbness on the left side of the face, described as similar to post-dental procedure numbness - Pressure sensation in the left ear - Jaw pain on the left side - No toothache - No prior episodes of similar facial swelling - No fever, ear drainage, or significant changes in hearing  Cutaneous abscesses and dermatitis - Frequent development of boils in multiple areas: around bra and panty line, back hip areas, between thighs, and on breasts - Boils typically become erythematous, inflamed, and eventually drain - No rashes present except for a scaly, pruritic spot between the breasts - Uses nystatin  powder for the scaly, itchy spot between the breasts - Upcoming dermatology appointment in April  Tobacco use history - Quit smoking four years ago  Sleep apnea - History of sleep apnea       Current Outpatient Medications  Medication Instructions   aluminum  chloride (DRYSOL) 20 % external solution Topical, Daily at bedtime   atomoxetine (STRATTERA) 40 mg, Oral, Daily   budesonide -formoterol  (SYMBICORT ) 80-4.5 MCG/ACT inhaler 2 puffs, Inhalation, 2 times daily PRN   FLUoxetine (PROZAC) 40 mg, Oral,  Daily   hydrOXYzine  (ATARAX ) 10 mg, Oral, 3 times daily   loratadine  (CLARITIN ) 10 mg, Oral, Daily   nystatin  powder 1 application , Topical, 3 times daily   omeprazole  (PRILOSEC) 40 MG capsule Take one capsule by mouth each morning and at bedtime. PLEASE SCHEDULE A FOLLOW UP APPOINTMENT FOR FURTHER REFILLS. Thank you   prochlorperazine  (COMPAZINE ) 5 mg, Oral, Every 8 hours PRN   sucralfate  (CARAFATE ) 1 g, Oral, 4 times daily   tirzepatide  (ZEPBOUND ) 2.5 mg, Subcutaneous, Weekly     Objective  Today's Vitals   09/03/24 1410  BP: 125/82  Pulse: 80  Temp: 98.6 F (37 C)  TempSrc: Oral  SpO2: 97%  Weight: 224 lb 12.8 oz (102 kg)  PainSc: 8   PainLoc: Face  Body mass index is 43.9 kg/m.  Physical Exam Constitutional:      Appearance: Normal appearance.  HENT:     Head:     Jaw: Tenderness and swelling present.      Comments: Nodule ~1 cm per above    Right Ear: Tympanic membrane, ear canal and external ear normal.     Left Ear: Tympanic membrane, ear canal and external ear normal.     Mouth/Throat:     Mouth: Mucous membranes are moist.     Pharynx: Oropharynx is clear.     Comments: Normal-looking teeth Eyes:     Pupils: Pupils are equal, round, and reactive to light.  Cardiovascular:     Rate and Rhythm: Normal rate and regular rhythm.  Pulmonary:  Effort: Pulmonary effort is normal. No respiratory distress.  Skin:    General: Skin is warm and dry.  Neurological:     Mental Status: She is alert.     Cranial Nerves: No facial asymmetry.     Comments: Decreased sensation to fine touch on left half of face. No facial asymmetry. Tongue and palate midline.  Psychiatric:        Mood and Affect: Affect normal.        Speech: Speech normal.        Behavior: Behavior normal.      Assessment & Plan Intertrigo Chronic, with recurrence of rash under breasts. Recommend drying area thoroughly after bathing with hair dryer on low. Will refill nystatin  powder which has  worked for her in the past. Orders:   nystatin  powder; Apply 1 Application topically 3 (three) times daily.   Swelling, mass, or lump on face Acute, undetermined cause. Query deep cyst or abscess, maybe lymph node. Schwannoma of trigeminal nerve is rare but possible. Neurologic deficits include decreased sensation to fine touch, but no facial asymmetry. Wonder about trigeminal nerve impingement, but no classic shooting pain as in typical trigeminal neuralgia. Check MRI of face for better characterization, ordered for STAT. Check CBC.  Orders:   MR FACE/TRIGEMINAL W/CM; Future   CBC with Differential/Platelet  Return in about 2 weeks (around 09/17/2024).  Ozell Kung MD 09/03/2024, 3:30 PM

## 2024-09-03 NOTE — Patient Instructions (Signed)
 We'll work to arrange MRI of your face to evaluate your swelling and pain.  If your pain or altered sensation gets rapidly worse, or your face looks very asymmetric, go get help at the emergency department.  VISIT SUMMARY: Today, you were seen for a painful swelling on the left side of your face, which has increased in size and caused numbness and jaw pain. We also discussed your history of recurrent boils and a scaly, itchy spot between your breasts.  YOUR PLAN: FACIAL SWELLING AND SUBCUTANEOUS NODULE, LEFT JAW: You have a painful swelling on the left side of your face, which may be due to a deep boil, lymph node involvement, or nerve impingement. -We have ordered an MRI of your face to get a better look at the swelling. -A complete blood count has been ordered to check for any underlying issues. -If your symptoms worsen, please seek emergency care immediately.  INTERTRIGO, INFRAMAMMARY REGION: You have a chronic scaly, itchy skin condition under your breasts, which is made worse by sweating. -Continue using the nystatin  powder as prescribed. -Keep the area dry and use a blow dryer on a low setting to help with this.  HISTORY OF RECURRENT BOILS AND ABSCESSES: You have a history of frequent boils and abscesses, and you have an upcoming dermatology appointment to further evaluate this. -Continue with your dermatology appointment in April to discuss your recurrent boils and abscesses.  Remember to bring all of the medications that you take (including over the counter medications and supplements) with you to every clinic visit.  This after visit summary is an important review of tests, referrals, and medication changes that were discussed during your visit. If you have questions or concerns, call [phone]. Outside of clinic business hours, call the main hospital at [phone] and ask the operator for the on-call internal medicine resident.   Ozell Kung MD 09/03/2024, 3:14 PM

## 2024-09-04 ENCOUNTER — Encounter: Payer: Self-pay | Admitting: Student

## 2024-09-04 ENCOUNTER — Ambulatory Visit: Payer: Self-pay | Admitting: Student

## 2024-09-04 LAB — CBC WITH DIFFERENTIAL/PLATELET
Basophils Absolute: 0.1 x10E3/uL (ref 0.0–0.2)
Basos: 0 %
EOS (ABSOLUTE): 0.3 x10E3/uL (ref 0.0–0.4)
Eos: 2 %
Hematocrit: 39.7 % (ref 34.0–46.6)
Hemoglobin: 13.2 g/dL (ref 11.1–15.9)
Immature Grans (Abs): 0 x10E3/uL (ref 0.0–0.1)
Immature Granulocytes: 0 %
Lymphocytes Absolute: 4 x10E3/uL — ABNORMAL HIGH (ref 0.7–3.1)
Lymphs: 34 %
MCH: 28.9 pg (ref 26.6–33.0)
MCHC: 33.2 g/dL (ref 31.5–35.7)
MCV: 87 fL (ref 79–97)
Monocytes Absolute: 0.5 x10E3/uL (ref 0.1–0.9)
Monocytes: 4 %
Neutrophils Absolute: 6.9 x10E3/uL (ref 1.4–7.0)
Neutrophils: 60 %
Platelets: 377 x10E3/uL (ref 150–450)
RBC: 4.57 x10E6/uL (ref 3.77–5.28)
RDW: 13.3 % (ref 11.7–15.4)
WBC: 11.8 x10E3/uL — ABNORMAL HIGH (ref 3.4–10.8)

## 2024-09-07 ENCOUNTER — Emergency Department (HOSPITAL_COMMUNITY)
Admission: EM | Admit: 2024-09-07 | Discharge: 2024-09-08 | Disposition: A | Discharge status: Home / Self Care | Attending: Emergency Medicine | Admitting: Emergency Medicine

## 2024-09-07 ENCOUNTER — Emergency Department (HOSPITAL_COMMUNITY)

## 2024-09-07 ENCOUNTER — Encounter (HOSPITAL_COMMUNITY): Payer: Self-pay | Admitting: *Deleted

## 2024-09-07 ENCOUNTER — Other Ambulatory Visit: Payer: Self-pay

## 2024-09-07 DIAGNOSIS — Q283 Other malformations of cerebral vessels: Secondary | ICD-10-CM

## 2024-09-07 DIAGNOSIS — D72829 Elevated white blood cell count, unspecified: Secondary | ICD-10-CM | POA: Insufficient documentation

## 2024-09-07 DIAGNOSIS — R200 Anesthesia of skin: Secondary | ICD-10-CM | POA: Insufficient documentation

## 2024-09-07 DIAGNOSIS — G508 Other disorders of trigeminal nerve: Secondary | ICD-10-CM

## 2024-09-07 DIAGNOSIS — R519 Headache, unspecified: Secondary | ICD-10-CM | POA: Diagnosis present

## 2024-09-07 DIAGNOSIS — Z79899 Other long term (current) drug therapy: Secondary | ICD-10-CM | POA: Insufficient documentation

## 2024-09-07 DIAGNOSIS — R930 Abnormal findings on diagnostic imaging of skull and head, not elsewhere classified: Secondary | ICD-10-CM | POA: Insufficient documentation

## 2024-09-07 LAB — CBC
HCT: 41.2 % (ref 36.0–46.0)
Hemoglobin: 13.6 g/dL (ref 12.0–15.0)
MCH: 29 pg (ref 26.0–34.0)
MCHC: 33 g/dL (ref 30.0–36.0)
MCV: 87.8 fL (ref 80.0–100.0)
Platelets: 347 K/uL (ref 150–400)
RBC: 4.69 MIL/uL (ref 3.87–5.11)
RDW: 12.7 % (ref 11.5–15.5)
WBC: 11.6 K/uL — ABNORMAL HIGH (ref 4.0–10.5)
nRBC: 0 % (ref 0.0–0.2)

## 2024-09-07 LAB — COMPREHENSIVE METABOLIC PANEL WITH GFR
ALT: 18 U/L (ref 0–44)
AST: 26 U/L (ref 15–41)
Albumin: 3.4 g/dL — ABNORMAL LOW (ref 3.5–5.0)
Alkaline Phosphatase: 51 U/L (ref 38–126)
Anion gap: 10 (ref 5–15)
BUN: 7 mg/dL (ref 6–20)
CO2: 19 mmol/L — ABNORMAL LOW (ref 22–32)
Calcium: 8.9 mg/dL (ref 8.9–10.3)
Chloride: 107 mmol/L (ref 98–111)
Creatinine, Ser: 0.64 mg/dL (ref 0.44–1.00)
GFR, Estimated: >60 mL/min (ref >60)
Glucose, Bld: 98 mg/dL (ref 70–99)
Potassium: 3.5 mmol/L (ref 3.5–5.1)
Sodium: 136 mmol/L (ref 135–145)
Total Bilirubin: 0.3 mg/dL (ref 0.0–1.2)
Total Protein: 7.1 g/dL (ref 6.5–8.1)

## 2024-09-07 LAB — HCG, SERUM, QUALITATIVE: Preg, Serum: NEGATIVE

## 2024-09-07 MED ORDER — CARBAMAZEPINE 100 MG PO CHEW
100.0000 mg | CHEWABLE_TABLET | Freq: Once | ORAL | Status: AC
Start: 2024-09-07 — End: 2024-09-07
  Administered 2024-09-07: 100 mg via ORAL
  Filled 2024-09-07 (×2): qty 1

## 2024-09-07 MED ORDER — CARBAMAZEPINE ER 100 MG PO CP12: 100.0000 mg | ORAL_CAPSULE | Freq: Two times a day (BID) | ORAL | 0 refills | Status: DC

## 2024-09-07 MED ORDER — LORAZEPAM 1 MG PO TABS
1.0000 mg | ORAL_TABLET | Freq: Once | ORAL | Status: AC
  Administered 2024-09-07: 1 mg via ORAL
  Filled 2024-09-07: qty 1

## 2024-09-07 MED ORDER — GADOBUTROL 1 MMOL/ML IV SOLN
10.0000 mL | Freq: Once | INTRAVENOUS | Status: AC | PRN
  Administered 2024-09-07: 10 mL via INTRAVENOUS

## 2024-09-07 MED ORDER — OXYCODONE HCL 5 MG PO TABS
10.0000 mg | ORAL_TABLET | Freq: Once | ORAL | Status: AC
  Administered 2024-09-07: 10 mg via ORAL
  Filled 2024-09-07: qty 2

## 2024-09-07 MED ORDER — ONDANSETRON HCL 4 MG/2ML IJ SOLN
4.0000 mg | Freq: Once | INTRAMUSCULAR | Status: AC
  Administered 2024-09-07: 4 mg via INTRAVENOUS
  Filled 2024-09-07: qty 2

## 2024-09-07 NOTE — ED Provider Notes (Cosign Needed Addendum)
 Received patient in signout from previous provider pending MRI.  See their note.  In short, patient presents emergency department for evaluation of left-sided facial pain and numbness for the past week.  She has decree sensation and stabbing pains in the left face that radiates to her ear and occasionally has muffled hearing.  Labs notable for mild leukocytosis at 11.6.  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 Deedra who recommended  *Carbamazepine 100 mg twice daily and increasing to 200 mg twice daily in the next 3 days if    symptoms are not controlled.   *MRI brain with and without contrast to ensure no MS. If MS or acute emergent abnormality is seen, patient to be admitted for MRI brain with and wo contrast for further characterization.   *Neurosurgery consult for definitive management of vascular loop syndrome.   *Patient can follow-up with neurology outpatient.  Consulted neurosurgery Meyran NP, and discussed ED workup, disposition.  They recommend to call office on Monday and schedule a new patient appointment for definitive management of vascular loop syndrome  Consulted radiologist regarding obtaining MRI brain as she just received contrast.  He recommends  *MRI brain without contrast as it is recommended that patients do not have contrast within 24 hours.   *If no MS is seen on MRI brain without, we do not need proceed with additional imaging however if mets are seen then patient can proceed with MRI brain with contrast.   Patient's MRI brain does not show any demyelinating lesions or signs of MS.  Patient can follow-up with neurosurgery for further management of vascular loop syndrome.  I provided these recommendations  and follow-ups on DC paperwork.  Also provided carbamazepine prescription with 1 dose provided in ED.  She can start this tomorrow.  Discussed ED workup, disposition, return to ED precautions with patient who expresses understanding agrees with plan.  All questions answered to their satisfaction.  They are agreeable to plan.  Discharge instructions provided on paperwork   Minnie Tinnie BRAVO, PA 09/08/24 0019    Dasie Faden, MD 09/10/24 [phone]

## 2024-09-07 NOTE — ED Notes (Signed)
 Patient transported to MRI

## 2024-09-07 NOTE — Plan of Care (Addendum)
 On-call neurology note  Called by EDP regarding this patient has been having left facial pain now for a week. 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 I have recommended starting carbamazepine. MR brain with and without contrast to rule out evidence of demyelination in the brain. I also recommended obtaining information from neurosurgery as to which neurosurgeon to refer her to for evaluation. She will need outpatient neurology follow-up as well.  Eligio Lav, MD Neurology

## 2024-09-07 NOTE — ED Notes (Signed)
 Patient transported back from MRI.

## 2024-09-07 NOTE — ED Notes (Addendum)
 This RN took over this patients care. Patient in room, pain assessed, vitals taken, neuro and primary assessment performed. Patient stated pain was a 9/10 that feels like electrical shocks and tingles to the face, neck, and head. EDP made aware.

## 2024-09-07 NOTE — ED Provider Notes (Signed)
 [place] EMERGENCY DEPARTMENT AT  HOSPITAL Provider Note   CSN: [id number] Arrival date & time: 09/07/24  1656     Patient presents with: Facial Numbness and Facial Swelling   Meghan Riggs is a 28 y.o. female.   Patient presents to the emergency department today for evaluation of worsening left sided facial pain and numbness.  Patient reports symptoms for about 1 week.  She has had decreased sensation and stabbing pains in the left face which involves the forehead, cheek, jawline.  Pain radiates to her ear.  Sometimes with sounds her hearing feels muffled and she has stabbing pains in her ear with certain sounds.  She feels that her symptoms have progressed since seeing her PCP 4 days ago.  They had ordered MRI face, trigeminal with contrast.  This has not yet been scheduled.  No upper extremity or lower extremity weakness.  Patient reports prior to symptoms starting, she had a stye of her left lower eyelid.  She states that PCP considered antibiotics but wanted to get MRI first.  No fevers.       Prior to Admission medications   Medication Sig Start Date End Date Taking? Authorizing Provider  aluminum  chloride (DRYSOL) 20 % external solution Apply topically at bedtime. 03/22/22   Bonanno, Marianne E, MD  atomoxetine (STRATTERA) 40 MG capsule Take 40 mg by mouth daily.    [provider]  budesonide -formoterol  (SYMBICORT ) 80-4.5 MCG/ACT inhaler Inhale 2 puffs into the lungs 2 (two) times daily as needed (For wheezing or Shortness of breath). 06/21/24   Kandis Perkins, DO  FLUoxetine (PROZAC) 40 MG capsule Take 40 mg by mouth daily. 08/16/21   [provider]  hydrOXYzine  (ATARAX ) 10 MG tablet Take 10 mg by mouth 3 (three) times daily. 05/25/21   [provider]  loratadine  (CLARITIN ) 10 MG tablet Take 1 tablet (10 mg total) by mouth daily. 02/16/23 02/16/24  Jolaine Pac, DO  nystatin  powder Apply 1 Application topically 3 (three) times  daily. 09/03/24   Norrine Sharper, MD  omeprazole  (PRILOSEC) 40 MG capsule Take one capsule by mouth each morning and at bedtime. PLEASE SCHEDULE A FOLLOW UP APPOINTMENT FOR FURTHER REFILLS. Thank you 06/24/22   Federico Rosario BROCKS, MD  prochlorperazine  (COMPAZINE ) 5 MG tablet Take 1 tablet (5 mg total) by mouth every 8 (eight) hours as needed for nausea or vomiting. 07/08/24   Elicia Sharper, DO  sucralfate  (CARAFATE ) 1 GM/10ML suspension Take 10 mLs (1 g total) by mouth 4 (four) times daily. 11/05/21   Federico Rosario BROCKS, MD  tirzepatide  (ZEPBOUND ) 2.5 MG/0.5ML injection vial Inject 2.5 mg into the skin once a week. 06/21/24   Kandis Perkins, DO    Allergies: Patient has no known allergies.    Review of Systems  Updated Vital Signs BP 133/78 (BP Location: Right Arm)   Pulse 77   Temp 98.2 F (36.8 C)   Resp 17   Ht 5' (1.524 m)   Wt 102 kg   LMP 08/01/2024   SpO2 100%   BMI 43.92 kg/m   Physical Exam Vitals and nursing note reviewed.  Constitutional:      Appearance: She is well-developed.  HENT:     Head: Normocephalic and atraumatic.     Right Ear: Tympanic membrane, ear canal and external ear normal.     Left Ear: Tympanic membrane, ear canal and external ear normal.     Ears:     Comments: Patient has some discomfort with insertion  of speculum into the left ear canal, however I do not see any vesicles, erythema, swelling.  TM appears normal.    Nose: Nose normal.     Mouth/Throat:     Mouth: Mucous membranes are moist.     Comments: Trace left preauricular swelling with out any palpable lymph nodes.  No signs of cellulitis.  Patient can open and close her jaw without difficulty.  No abscess noted. Eyes:     Conjunctiva/sclera: Conjunctivae normal.  Pulmonary:     Effort: No respiratory distress.  Musculoskeletal:     Cervical back: Normal range of motion and neck supple.  Skin:    General: Skin is warm and dry.  Neurological:     Mental Status: She is alert.     (all labs  ordered are listed, but only abnormal results are displayed) Labs Reviewed  COMPREHENSIVE METABOLIC PANEL WITH GFR - Abnormal; Notable for the following components:      Result Value   CO2 19 (*)    Albumin 3.4 (*)    All other components within normal limits  CBC - Abnormal; Notable for the following components:   WBC 11.6 (*)    All other components within normal limits  HCG, SERUM, QUALITATIVE    EKG: None  Radiology: No results found.   Procedures   Medications Ordered in the ED  gadobutrol  (GADAVIST ) 1 MMOL/ML injection 10 mL (10 mLs Intravenous Contrast Given 09/07/24 2028)  oxyCODONE  (Oxy IR/ROXICODONE ) immediate release tablet 10 mg (10 mg Oral Given 09/07/24 2046)    ED Course  Patient seen and examined. History obtained directly from patient.   Labs/EKG: Ordered CBC, CMP, pregnancy ordered in triage.  Imaging: Given progressive symptoms, will go ahead and try to obtain the MRI ordered by PCP.  Medications/Fluids: None ordered  Most recent vital signs reviewed and are as follows: BP 133/78 (BP Location: Right Arm)   Pulse 77   Temp 98.2 F (36.8 C)   Resp 17   Ht 5' (1.524 m)   Wt 102 kg   LMP 08/01/2024   SpO2 100%   BMI 43.92 kg/m   Initial impression: Patient with neuropathic pain of the face, reported swelling earlier in the week preauricular, evaluate for any signs of trigeminal nerve compression.  8:46 PM patient returned from MRI.  Oxycodone  10 mg ordered for pain.  Lab workup personally reviewed and interpreted including CBC which shows mildly elevated white blood cell count, CMP unremarkable, negative pregnancy.  Uncertain what MRI will show. Discussed with Minnie RIGGERS at shift change, who will follow-up on results.  If MRI demonstrates any neurologic abnormality --would consider discussion with neurology for treatment recommendations.  If reassuring --consider discharge home with pain medication, close outpatient follow-up with IMTS.                                    Medical Decision Making Amount and/or Complexity of Data Reviewed Labs: ordered. Radiology: ordered.  Risk Prescription drug management.   Patient with severe left-sided facial pain, awaiting completion of imaging workup.     Final diagnoses:  Facial pain    ED Discharge Orders     None          Desiderio Chew, PA-C 09/07/24 2050    Francesca Elsie CROME, MD 09/08/24 1104

## 2024-09-07 NOTE — ED Notes (Signed)
 Patient back from MRI.

## 2024-09-07 NOTE — ED Triage Notes (Signed)
 Pt c/o L side facial numbness and swelling x4 days.  Pt reports symptoms started w/ a knot in front of her L ear.  Pt has been seen by PCP and awaiting a MRI.

## 2024-09-08 MED ORDER — CARBAMAZEPINE ER 100 MG PO CP12: 100.0000 mg | ORAL_CAPSULE | Freq: Two times a day (BID) | ORAL | 0 refills | Status: DC

## 2024-09-08 NOTE — Discharge Instructions (Addendum)
 Thank you for letting us  evaluate you today.  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  As discussed, please follow-up with neurosurgery next week for further management.  I have given you carbamazepine for symptom control.  Have also provided you with neurology follow-up.  They should call you within the next 1-2 weeks however you can call them directly if they do not call you. MRI of brain did not show any abnormalities, masses, nor lesions.  Return to Emergency Department if you experience altered mentation, inability to walk, loss of vision, severe debilitating headache, worsening symptoms

## 2024-09-09 ENCOUNTER — Encounter: Payer: Self-pay | Admitting: Neurology

## 2024-09-09 ENCOUNTER — Ambulatory Visit: Admitting: Neurology

## 2024-09-09 VITALS — BP 132/85 | HR 75 | Ht 60.0 in | Wt 224.0 lb

## 2024-09-09 DIAGNOSIS — G43709 Chronic migraine without aura, not intractable, without status migrainosus: Secondary | ICD-10-CM | POA: Diagnosis not present

## 2024-09-09 DIAGNOSIS — R519 Headache, unspecified: Secondary | ICD-10-CM | POA: Insufficient documentation

## 2024-09-09 MED ORDER — PREGABALIN 150 MG PO CAPS: 150.0000 mg | ORAL_CAPSULE | Freq: Three times a day (TID) | ORAL | 5 refills | Status: AC

## 2024-09-09 MED ORDER — GABAPENTIN 300 MG PO CAPS: 300.0000 mg | ORAL_CAPSULE | Freq: Three times a day (TID) | ORAL | 11 refills | Status: DC

## 2024-09-09 MED ORDER — PREGABALIN 150 MG PO CAPS: 150.0000 mg | ORAL_CAPSULE | Freq: Two times a day (BID) | ORAL | 5 refills | Status: DC

## 2024-09-09 MED ORDER — SUMATRIPTAN SUCCINATE 100 MG PO TABS: 100.0000 mg | ORAL_TABLET | Freq: Once | ORAL | 11 refills | Status: AC | PRN

## 2024-09-09 NOTE — Progress Notes (Signed)
 Internal Medicine Clinic Attending  I was physically present during the key portions of the resident provided service and participated in the medical decision making of patient's management care. I reviewed pertinent patient test results.  The assessment, diagnosis, and plan were formulated together and I agree with the documentation in the resident's note.  Dickie La, MD

## 2024-09-09 NOTE — Progress Notes (Unsigned)
 Chief Complaint  Patient presents with   RM14    Pt is here Alone. Pt states that she went to the hospital for numbness in her face, and is having pain.       ASSESSMENT AND PLAN  Meghan Riggs is a 28 y.o. female   Left facial pain Long history of chronic migraine  Recurrent facial pain is not typical for trigeminal neuralgia  MRI of the brain without contrast, MRI of trigeminal nerve with without contrast on September 07, 2024 did show subtle asymmetric enhancement of the left trigeminal nerve at nerve entry zone with evidence of the adjacent closely traveling vessels,  Gabapentin up to 1200 mg a day without helping, complains of significant pain we will add on Lyrica 150 mg 3 times a day  Patient has obesity high risk for intracranial hypertension, referred to ophthalmology for further evaluation    DIAGNOSTIC DATA (LABS, IMAGING, TESTING) - I reviewed patient records, labs, notes, testing and imaging myself where available.   MEDICAL HISTORY:  Meghan Riggs  is a 28 year old female, seen in request by her primary care doctor Meghan Riggs, for evaluation of left facial pain, initial evaluation was on September 09, 2024  History is obtained from the patient and review of electronic medical records. I personally reviewed pertinent available imaging films in PACS.   PMHx of  Depression, anxiety ADHD Obesity  Since September 01, 2024, she noticed a left cheek area not, hard like a marble, even though I did not feel any mass at today's examination, later she noticed numbness of left face, spread out from left ear along her left jawline, cheek, also to left forehead region, intermittent sharp pain,  Then the pain moved to both side, headache all over,  She did have a history of migraine headache couple times a month, with light noise sensitivity, nauseous, described current pain is different from previous migraine headaches  Personally reviewed MRI of the brain, MRI  face and trigeminal with without contrast from September 07, 2024, there was subtle asymmetric enhancement of the left trigeminal nerve at nerve entry disease with evidence of adjacent closely transversing vessels,  She was referred to neurosurgeon by emergency room physician, she took her family's  gabapentin 600 mg twice daily with limited help  PHYSICAL EXAM:   Vitals:   09/09/24 1549  BP: 132/85  Pulse: 75  Weight: 224 lb (101.6 kg)  Height: 5' (1.524 m)   Not recorded     Body mass index is 43.75 kg/m.  PHYSICAL EXAMNIATION:  Gen: NAD, conversant, well nourised, well groomed                     Cardiovascular: Regular rate rhythm, no peripheral edema, warm, nontender. Eyes: Conjunctivae clear without exudates or hemorrhage Neck: Supple, no carotid bruits. Pulmonary: Clear to auscultation bilaterally   NEUROLOGICAL EXAM:  MENTAL STATUS: Speech/cognition: Awake, alert, oriented to history taking and casual conversation CRANIAL NERVES: CN II: Visual fields are full to confrontation. Pupils are round equal and briskly reactive to light.  Funduscopy showed mild blurry of nasal half of left disc CN III, IV, VI: extraocular movement are normal. No ptosis. CN V: Facial sensation is intact to light touch CN VII: Face is symmetric with normal eye closure  CN VIII: Hearing is normal to causal conversation. CN IX, X: Phonation is normal. CN XI: Head turning and shoulder shrug are intact  MOTOR: There is no pronator drift of out-stretched arms.  Muscle bulk and tone are normal. Muscle strength is normal.  REFLEXES: Reflexes are 2+ and symmetric at the biceps, triceps, knees, and ankles. Plantar responses are flexor.  SENSORY: Intact to light touch, pinprick and vibratory sensation are intact in fingers and toes.  COORDINATION: There is no trunk or limb dysmetria noted.  GAIT/STANCE: Posture is normal. Gait is steady with normal steps, base, arm swing, and turning. Heel  and toe walking are normal. Tandem gait is normal.  Romberg is absent.  REVIEW OF SYSTEMS:  Full 14 system review of systems performed and notable only for as above All other review of systems were negative.   ALLERGIES: No Known Allergies  HOME MEDICATIONS: Current Outpatient Medications  Medication Sig Dispense Refill   aluminum  chloride (DRYSOL) 20 % external solution Apply topically at bedtime. 35 mL 2   atomoxetine (STRATTERA) 40 MG capsule Take 40 mg by mouth daily.     budesonide -formoterol  (SYMBICORT ) 80-4.5 MCG/ACT inhaler Inhale 2 puffs into the lungs 2 (two) times daily as needed (For wheezing or Shortness of breath). 1 each 12   carbamazepine  (CARBATROL ) 100 MG 12 hr capsule Take 1 capsule (100 mg total) by mouth 2 (two) times daily. If symptoms do not improve within 3 days, increase to 2 capsules (200mg ) by mouth two times daily 60 capsule 0   FLUoxetine (PROZAC) 40 MG capsule Take 40 mg by mouth daily.     hydrOXYzine  (ATARAX ) 10 MG tablet Take 10 mg by mouth 3 (three) times daily.     loratadine  (CLARITIN ) 10 MG tablet Take 1 tablet (10 mg total) by mouth daily. 30 tablet 2   nystatin  powder Apply 1 Application topically 3 (three) times daily. 15 g 0   omeprazole  (PRILOSEC) 40 MG capsule Take one capsule by mouth each morning and at bedtime. PLEASE SCHEDULE A FOLLOW UP APPOINTMENT FOR FURTHER REFILLS. Thank you 60 capsule 1   prochlorperazine  (COMPAZINE ) 5 MG tablet Take 1 tablet (5 mg total) by mouth every 8 (eight) hours as needed for nausea or vomiting. 15 tablet 0   sucralfate  (CARAFATE ) 1 GM/10ML suspension Take 10 mLs (1 g total) by mouth 4 (four) times daily. 420 mL 1   tirzepatide  (ZEPBOUND ) 2.5 MG/0.5ML injection vial Inject 2.5 mg into the skin once a week. 2 mL 1   No current facility-administered medications for this visit.    PAST MEDICAL HISTORY: Past Medical History:  Diagnosis Date   Acute otitis media 12/03/2021   Anemia    Anxiety    Arthritis     Asthma    Dr. 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   Depression    DM (diabetes mellitus) (HCC)    Gastritis    GERD (gastroesophageal reflux disease)    HTN (hypertension)    Hyperlipidemia    Internal hemorrhoids    Osteoarthritis    Viral URI with cough 11/22/2021    PAST SURGICAL HISTORY: Past Surgical History:  Procedure Laterality Date   AXILLARY LYMPH NODE DISSECTION Left 2006   CESAREAN SECTION  07/2020   CLUB FOOT RELEASE Bilateral 7340   @ 74 days old   COLONOSCOPY     KNEE SURGERY Left 2010   LAPAROSCOPIC APPENDECTOMY N/A 06/04/2017   Procedure: APPENDECTOMY LAPAROSCOPIC;  Surgeon: Vanderbilt Ned, MD;  Location: [facility] OR;  Service: General;  Laterality: N/A;   UPPER GASTROINTESTINAL ENDOSCOPY      FAMILY HISTORY: Family History  Problem Relation Age  of Onset   COPD Mother    Asthma Mother    Cervical cancer Mother    Interstitial cystitis Mother    Yvone' disease Mother    Diabetes Father    Brain cancer Father    Heart failure Father    Club foot Brother    Diabetes Paternal Uncle    COPD Maternal Grandmother    Asthma Maternal Grandmother    Cirrhosis Maternal Grandfather    Diabetes Paternal Grandmother    Breast cancer Paternal Grandmother    Heart disease Paternal Grandfather    Colon cancer Neg Hx    Colon polyps Neg Hx    Esophageal cancer Neg Hx    Rectal cancer Neg Hx    Stomach cancer Neg Hx     SOCIAL HISTORY: Social History   Socioeconomic History   Marital status: Married    Spouse name: Not on file   Number of children: 1   Years of education: Not on file   Highest education level: Associate degree: occupational, scientist, product/process development, or vocational program  Occupational History   Occupation: SAHM  Tobacco Use   Smoking status: Former    Current packs/day: 0.00    Types: Cigarettes    Quit date: 01/24/2022    Years since quitting: 2.6   Smokeless tobacco: Never   Tobacco comments:    1  Social sometimes   Vaping  Use   Vaping status: Never Used  Substance and Sexual Activity   Alcohol use: Yes    Comment: seldom   Drug use: No    Comment: hx marijuana use per [facility] record 01/29/20   Sexual activity: Yes    Birth control/protection: Inserts, None  Other Topics Concern   Not on file  Social History Narrative   Not on file   Social Drivers of Health   Financial Resource Strain: Medium Risk (02/26/2024)   Received from [facility]   Overall Financial Resource Strain (CARDIA)    Difficulty of Paying Living Expenses: Somewhat hard  Food Insecurity: No Food Insecurity (02/26/2024)   Received from [facility]   Hunger Vital Sign    Within the past 12 months, you worried that your food would run out before you got the money to buy more.: Never true    Within the past 12 months, the food you bought just didn't last and you didn't have money to get more.: Never true  Transportation Needs: No Transportation Needs (02/26/2024)   Received from [facility] - Transportation    Lack of Transportation (Medical): No    Lack of Transportation (Non-Medical): No  Physical Activity: Insufficiently Active (02/01/2024)   Exercise Vital Sign    Days of Exercise per Week: 3 days    Minutes of Exercise per Session: 30 min  Stress: Stress Concern Present (02/26/2024)   Received from [facility] - Occupational Stress Questionnaire    Feeling of Stress : To some extent  Social Connections: Socially Integrated (02/26/2024)   Received from [facility]   Social Network    How would you rate your social network (family, work, friends)?: Good participation with social networks  Recent Concern: Social Connections - Moderately Isolated (02/01/2024)   Social Connection and Isolation Panel    Frequency of Communication with Friends and Family: More than three times a week    Frequency of Social Gatherings with Friends and Family: More than three times a week  Attends Religious Services: Never    Active Member of Clubs or Organizations: No    Attends Banker Meetings: Not on file    Marital Status: Living with partner  Intimate Partner Violence: Not At Risk (02/26/2024)   Received from [facility]   HITS    Over the last 12 months how often did your partner physically hurt you?: Never    Over the last 12 months how often did your partner insult you or talk down to you?: Never    Over the last 12 months how often did your partner threaten you with physical harm?: Never    Over the last 12 months how often did your partner scream or curse at you?: Never      Modena Callander, M.D. Ph.D.  [facility] [address] [place], [place] [address] Ph: [phone] Fax: [phone]  CC:  Minnie Tinnie BRAVO, PA [address] [place],  [place] [address]  Meghan Perkins, DO

## 2024-09-10 ENCOUNTER — Other Ambulatory Visit: Payer: Self-pay | Admitting: Neurosurgery

## 2024-09-10 ENCOUNTER — Encounter: Payer: Self-pay | Admitting: Neurosurgery

## 2024-09-10 ENCOUNTER — Ambulatory Visit: Admitting: Neurosurgery

## 2024-09-10 ENCOUNTER — Ambulatory Visit: Admitting: Student

## 2024-09-10 VITALS — BP 134/71 | HR 69 | Temp 99.1°F | Ht 60.0 in | Wt 225.0 lb

## 2024-09-10 DIAGNOSIS — G500 Trigeminal neuralgia: Secondary | ICD-10-CM

## 2024-09-10 NOTE — Progress Notes (Signed)
 Assessment : Discussed the use of AI scribe software for clinical note transcription with the patient, who gave verbal consent to proceed.  History of Present Illness Meghan Riggs is a 28 year old female who presents with left-sided facial pain and numbness.  She experiences severe left-sided facial pain and numbness that began on the Sunday before Thanksgiving. The pain is described as shooting and electric, starting in front of her ear and radiating down to her jaw and around her eye. It feels like being electrocuted, with the pain 'spidering out' from the initial site. The left side of her face is numb, and there is swelling on the same side.  The pain is constant, with episodes of shooting and electric pain occurring multiple times daily. It is exacerbated by cold weather and activities such as eating and brushing her teeth. The left side of her face is particularly sensitive to touch, especially near her cheek, which can trigger the pain to radiate.  She initially sought care from her primary care physician on the Tuesday following the onset of symptoms. Her PCP prescribed antibiotics and recommended an MRI, which was delayed due to the holiday. On the following Saturday, she visited the emergency room due to worsening pain. There, she underwent two MRIs: one of the localized area and another of her brain without contrast. She was referred to a neurologist.  She visited a neurologist who prescribed Lyrica  and referred her to an eye doctor, but she has not yet started the Lyrica . She is currently taking Prozac, hydroxyzine , and Compazine . She notes that the swelling has decreased since her emergency room visit, but the pain persists. She describes the pain as severe enough to 'put her down' and notes that it feels like nerve pain, similar to the sensation of eating ice cream without having done so.  Her past medical history includes asthma since childhood. She reports no heart problems.  She does not smoke.    Plan : I am sorry to hear that she is in so much pain.  It is very peculiar that she is having these lancinating pains in all 3 distributions of the trigeminal nerve on the left.  1 could also have left ear pain but this which is sometimes also seen in the setting of glossopharyngeal neuralgia which I do not suspect actually that she has.  The MRI of the trigeminal nerve suggested some faint enhancement at the origin of the left trigeminal nerve.  This can be seen if there is an inflammatory process but given the enhancement of nervous tissue in the setting of hyperactivity as well as I am not so sure pathognomonic this is for a demyelination.  Regardless, I would like to get a lumbar puncture and have put in for some lab work to rule out demyelinating disease although this is highly unlikely given the fact that there are no plaques seen on the MRI. I have also have a subset of patients who have obesity and in the setting thereof have developed cranial neuropathy.  In these patients, letting CSF of will make her facial pain better.  I told her that I would like to get a lumbar puncture for the above 2 mentioned reasons and would like for her to register whether or not she has any improvement of her facial pain after the lumbar trigger.  She already has an appointment with an ophthalmologist which will be important to see whether or not there is any indication of optic neuritis as well.  I will see her back at the end of the week and I requested her not to take any Lyrica  or carbamazepine  in order not to cloud the diagnostic purpose of the lumbar puncture.   Social History   Socioeconomic History   Marital status: Married    Spouse name: Not on file   Number of children: 1   Years of education: Not on file   Highest education level: Associate degree: occupational, scientist, product/process development, or vocational program  Occupational History   Occupation: SAHM  Tobacco Use   Smoking  status: Former    Current packs/day: 0.00    Types: Cigarettes    Quit date: 01/24/2022    Years since quitting: 2.6   Smokeless tobacco: Never   Tobacco comments:    1  Social sometimes   Vaping Use   Vaping status: Never Used  Substance and Sexual Activity   Alcohol use: Yes    Comment: seldom   Drug use: No    Comment: hx marijuana use per [facility] record 01/29/20   Sexual activity: Yes    Birth control/protection: Inserts, None  Other Topics Concern   Not on file  Social History Narrative   Not on file   Social Drivers of Health   Financial Resource Strain: Medium Risk (02/26/2024)   Received from [facility]   Overall Financial Resource Strain (CARDIA)    Difficulty of Paying Living Expenses: Somewhat hard  Food Insecurity: No Food Insecurity (02/26/2024)   Received from [facility]   Hunger Vital Sign    Within the past 12 months, you worried that your food would run out before you got the money to buy more.: Never true    Within the past 12 months, the food you bought just didn't last and you didn't have money to get more.: Never true  Transportation Needs: No Transportation Needs (02/26/2024)   Received from [facility] - Transportation    Lack of Transportation (Medical): No    Lack of Transportation (Non-Medical): No  Physical Activity: Insufficiently Active (02/01/2024)   Exercise Vital Sign    Days of Exercise per Week: 3 days    Minutes of Exercise per Session: 30 min  Stress: Stress Concern Present (02/26/2024)   Received from [facility] - Occupational Stress Questionnaire    Feeling of Stress : To some extent  Social Connections: Socially Integrated (02/26/2024)   Received from [facility]   Social Network    How would you rate your social network (family, work, friends)?: Good participation with social networks  Recent Concern: Social Connections - Moderately Isolated (02/01/2024)   Social  Connection and Isolation Panel    Frequency of Communication with Friends and Family: More than three times a week    Frequency of Social Gatherings with Friends and Family: More than three times a week    Attends Religious Services: Never    Database Administrator or Organizations: No    Attends Engineer, Structural: Not on file    Marital Status: Living with partner  Intimate Partner Violence: Not At Risk (02/26/2024)   Received from [facility]   HITS    Over the last 12 months how often did your partner physically hurt you?: Never    Over the last 12 months how often did your partner insult you or talk down to you?: Never    Over the last 12 months how often did your partner  threaten you with physical harm?: Never    Over the last 12 months how often did your partner scream or curse at you?: Never    Family History  Problem Relation Age of Onset   COPD Mother    Asthma Mother    Cervical cancer Mother    Interstitial cystitis Mother    Yvone' disease Mother    Diabetes Father    Brain cancer Father    Heart failure Father    Club foot Brother    Diabetes Paternal Uncle    COPD Maternal Grandmother    Asthma Maternal Grandmother    Cirrhosis Maternal Grandfather    Diabetes Paternal Grandmother    Breast cancer Paternal Grandmother    Heart disease Paternal Grandfather    Colon cancer Neg Hx    Colon polyps Neg Hx    Esophageal cancer Neg Hx    Rectal cancer Neg Hx    Stomach cancer Neg Hx     No Known Allergies  Past Medical History:  Diagnosis Date   Acute otitis media 12/03/2021   Anemia    Anxiety    Arthritis    Asthma    Dr. 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   Depression    DM (diabetes mellitus) (HCC)    Gastritis    GERD (gastroesophageal reflux disease)    HTN (hypertension)    Hyperlipidemia    Internal hemorrhoids    Osteoarthritis    Viral URI with cough 11/22/2021    Past Surgical History:  Procedure  Laterality Date   AXILLARY LYMPH NODE DISSECTION Left 2006   CESAREAN SECTION  07/2020   CLUB FOOT RELEASE Bilateral 4766   @ 41 days old   COLONOSCOPY     KNEE SURGERY Left 2010   LAPAROSCOPIC APPENDECTOMY N/A 06/04/2017   Procedure: APPENDECTOMY LAPAROSCOPIC;  Surgeon: Vanderbilt Ned, MD;  Location: [facility] OR;  Service: General;  Laterality: N/A;   UPPER GASTROINTESTINAL ENDOSCOPY       Physical Exam   Physical Exam HENT:     Head: Normocephalic.     Nose: Nose normal.  Eyes:     Pupils: Pupils are equal, round, and reactive to light.  Cardiovascular:     Rate and Rhythm: Normal rate.  Pulmonary:     Effort: Pulmonary effort is normal.  Abdominal:     General: Abdomen is flat.  Musculoskeletal:     Cervical back: Normal range of motion.  Neurological:     Mental Status: Patient is alert.     Cranial Nerves: Cranial nerves 2-12 are intact, except ptosis OS and diplopia upon LEFT ward gaze    Sensory: Sensation is intact.     Motor: Motor function is intact.     Coordination: Coordination is intact.     Results for orders placed or performed during the hospital encounter of 09/07/24  MR BRAIN WO CONTRAST   Narrative   CLINICAL DATA:  Initial evaluation for possible demyelinating disease.  EXAM: MRI HEAD WITHOUT CONTRAST  TECHNIQUE: Multiplanar, multiecho pulse sequences of the brain and surrounding structures were obtained without intravenous contrast.  COMPARISON:  Prior MRI from earlier the same day.  FINDINGS: Brain: Cerebral volume within normal limits. Single punctate subcentimeter focus of FLAIR hyperintensity noted involving the subcortical left parietal lobe (series 13, image 81), nonspecific. No other focal parenchymal signal abnormality or significant cerebral white matter disease. No evidence for demyelinating disease.  No  abnormal foci of restricted diffusion to suggest acute or subacute ischemia. Gray-white matter differentiation maintained.  No areas of chronic cortical infarction or other insult. No acute or chronic intracranial blood products.  No mass lesion, midline shift or mass effect. No hydrocephalus or extra-axial fluid collection. Pituitary gland and suprasellar region within normal limits.  Vascular: Major intracranial vascular flow voids are maintained.  Skull and upper cervical spine: Craniocervical junction within normal limits. Decreased T1 signal intensity within the visualized bone marrow, nonspecific, but most commonly related to anemia, smoking or obesity. No scalp soft tissue abnormality.  Sinuses/Orbits: Globes orbital soft tissues within normal limits. Paranasal sinuses are clear. No mastoid effusion.  Other: None.  IMPRESSION: Normal brain MRI. No evidence for demyelinating disease or other acute intracranial abnormality.   Electronically Signed   By: Morene Hoard M.D.   On: 09/08/2024 00:12

## 2024-09-11 ENCOUNTER — Telehealth: Payer: Self-pay | Admitting: Neurology

## 2024-09-11 ENCOUNTER — Inpatient Hospital Stay
Admission: RE | Admit: 2024-09-11 | Discharge: 2024-09-11 | Disposition: A | Source: Ambulatory Visit | Attending: Neurosurgery | Admitting: Neurosurgery

## 2024-09-11 ENCOUNTER — Inpatient Hospital Stay: Payer: Self-pay | Admitting: Student

## 2024-09-11 VITALS — BP 139/86 | HR 60 | Resp 21

## 2024-09-11 DIAGNOSIS — G500 Trigeminal neuralgia: Secondary | ICD-10-CM

## 2024-09-11 NOTE — Discharge Instructions (Signed)

## 2024-09-11 NOTE — Telephone Encounter (Signed)
Referral for ophthalmology fax to [facility]. Phone: [phone], Fax: [phone].

## 2024-09-13 ENCOUNTER — Ambulatory Visit: Admitting: Neurosurgery

## 2024-09-13 ENCOUNTER — Encounter: Payer: Self-pay | Admitting: Neurosurgery

## 2024-09-13 VITALS — BP 129/77 | HR 67 | Temp 98.3°F | Ht 60.0 in | Wt 229.6 lb

## 2024-09-13 DIAGNOSIS — G500 Trigeminal neuralgia: Secondary | ICD-10-CM | POA: Diagnosis not present

## 2024-09-13 LAB — MULTIPLE SCLEROSIS(MS) PROFILE
Albumin: 4.1 g/dL (ref 4.0–5.0)
IgG (Immunoglobin G), Serum: 1153 mg/dL (ref 586–1602)

## 2024-09-13 NOTE — Progress Notes (Signed)
 28 year old lady with new onset of left-sided burning pain in her face with intermittent lancinating pain as well.  This started before Thanksgiving this year and has been persistent.  An MRI was obtained which demonstrated some faint enhancement at the origin of the left sided trigeminal nerve.  The rest of the MRI did not suggest any plaques.  When I saw her we talked about getting a lumbar puncture which she had done which demonstrated opening pressure of 20 cm.  20 cc of CSF was taken off.  After this, for about 36 hours she had improvement of the lancinating part of her pain but the burning pain was still there.  I shared with her that it is very well possible that she has weight related elevated intracranial pressure elevation [BMI of 45] and at this moment I think it would be prudent for her to start carbamazepine  and see how she does with this.  Additionally, I think that she needs to get a weight loss regimen and she is going to talk to her primary care doctor about starting Zepbound .  This drug is necessary in her case because of consequences of her weight causing neurological symptoms.  She is also going to see her eye doctor.  I will talk to her again on Friday and explicitly told her to keep track of how her pain is to see if the drug is going to be helpful.  I will also keep an eye out on the labs that were obtained for MS.

## 2024-09-17 ENCOUNTER — Telehealth: Payer: Self-pay | Admitting: Neurosurgery

## 2024-09-17 DIAGNOSIS — G500 Trigeminal neuralgia: Secondary | ICD-10-CM

## 2024-09-17 MED ORDER — CARBAMAZEPINE ER 100 MG PO CP12: 100.0000 mg | ORAL_CAPSULE | Freq: Two times a day (BID) | ORAL | 0 refills | Status: AC

## 2024-09-17 NOTE — Telephone Encounter (Signed)
 Prior authorization started for Patient's medication via Cover my meds portal. Auth# [id number]

## 2024-09-17 NOTE — Telephone Encounter (Signed)
 I called the pharmacy in regards to patient's medication as listed below.   carbamazepine  (CARBATROL ) 100 MG 12 hr capsule 100 mg, 2 times daily           Per patient's medication dispense report she never got the medication after it was prescribed in the ER. I asked the pharmacy to check why.   They said that the insurance has requested prior authorization and this had not been completed.   They asked that our office replace the order so that I can complete the PA for the patient to receive the medication.   I will refill the medication with [facility] from Dr. Janjua and complete the authorization process so she can get this medication.

## 2024-09-17 NOTE — Telephone Encounter (Signed)
 Patient called stating that she has been unable to pick up her prescription that Dr Janjua wanted her to start taking due to Medicaid needing prior auth for the medication. She said this may be because a different provider originally ordered the medication. Please advise as patient as follow up appointment Friday 09/20/24

## 2024-09-17 NOTE — Telephone Encounter (Signed)
 Approved today by [facility] [place]  Medicaid PA Case: [id number], Status: Approved, Coverage Starts on: 09/17/2024 12:00:00 AM, Coverage Ends on: 09/17/2025 12:00:00 AM. Effective Date: 09/17/2024 Authorization Expiration Date: 09/17/2025 Drug carBAMazepine  ER 100MG  er capsules ePA cloud logo Form [facility] [employer] [place]  Medicaid Electronic PA Form [phone] NCPDP)

## 2024-09-17 NOTE — Telephone Encounter (Signed)
 Left voicemail for patient to call back to get more information about the medication she is talking about.

## 2024-09-20 ENCOUNTER — Telehealth: Admitting: Neurosurgery

## 2024-09-20 DIAGNOSIS — G501 Atypical facial pain: Secondary | ICD-10-CM

## 2024-09-20 DIAGNOSIS — G500 Trigeminal neuralgia: Secondary | ICD-10-CM

## 2024-09-20 MED ORDER — ACETAZOLAMIDE 125 MG PO TABS: 125.0000 mg | ORAL_TABLET | Freq: Three times a day (TID) | ORAL | 0 refills | Status: DC

## 2024-09-20 NOTE — Progress Notes (Signed)
 Virtual Visit via Video Note  I connected with Meghan Riggs on 09/20/2024 at  4:20 PM EST by a video enabled telemedicine application and verified that I am speaking with the correct person using two identifiers.  Location: Patient: Home Provider: Clinic   I discussed the limitations of evaluation and management by telemedicine and the availability of in person appointments. The patient expressed understanding and agreed to proceed.  28 year old lady with left-sided atypical facial pain with typical components interspersed.  We did a lumbar puncture which demonstrated an opening pressure of 20 cm and CSF was taken off.  After this, for about 36 hours, the stabbing pain went away but the burning was still there.  We started her on carbamazepine  and she is currently taking 200 mg.  She says that for a few hours after she takes the medication for she feels better but then the pain returns.  I told her that I would like for her to go up to 300 mg and explained why I am doing what I am doing in detail, multiple times, in different words to both her and her husband.  I told her that the purpose here is to take care of her symptoms with carbamazepine  but also I would like to start Diamox to lower the intracranial pressure and treat the purported cause of her facial pain.  She said that she understood our plan for now.  She wants me to also refer her to an eye doctor and I will make that referral.  I told her that the lab work came back negative for MS but she still wants to be referred to a different neurologist than she has seen.  I will make that referral as well.  MEDICATION REGIMEN: *Carbamazepine : 100 mg 3 times a day  I would like for her to escalate the dose of Diamox as follows:  *DIAMOX  First week: 125mg  at night Second week: 250mg  at night Third week: 125mg  in the morning and 250mg  at night Fourth week: 250mg  twice a day    I discussed the assessment and treatment plan  with the patient. The patient was provided an opportunity to ask questions and all were answered. The patient agreed with the plan and demonstrated an understanding of the instructions.   The patient was advised to call back or seek an in-person evaluation if the symptoms worsen or if the condition fails to improve as anticipated.  I provided 20 minutes of non-face-to-face time during this encounter.   Chanie Soucek, MD

## 2024-09-22 LAB — MULTIPLE SCLEROSIS PANEL 2
Albumin Serum: 3.5 g/dL — ABNORMAL LOW (ref 3.6–5.1)
Albumin, CSF: 24.5 mg/dL (ref 8.0–42.0)
CNS-IgG Synthesis Rate: -1.5 mg/(24.h) (ref <=3.3)
IgG (Immunoglobin G), Serum: 1170 mg/dL (ref 600–1640)
IgG Total CSF: 4.2 mg/dL (ref 0.8–7.7)
IgG-Index: 0.51 (ref <0.70)
Myelin Basic Protein: <1170 ug/L (ref 600–4.0)
Oligo Bands: 3.5 g/dL — AB (ref 3.6–5.1)

## 2024-09-22 LAB — CSF CELL COUNT WITH DIFFERENTIAL
RBC Count, CSF: 11 {cells}/uL — ABNORMAL HIGH
TOTAL NUCLEATED CELL: 3 {cells}/uL (ref 0–5)

## 2024-09-22 LAB — PROTEIN, CSF: Total Protein, CSF: 45 mg/dL (ref 15–45)

## 2024-09-22 LAB — GLUCOSE, CSF: Glucose, CSF: 64 mg/dL (ref 40–80)

## 2024-09-24 ENCOUNTER — Encounter: Payer: Self-pay | Admitting: Neurology

## 2024-09-25 ENCOUNTER — Encounter: Payer: Self-pay | Admitting: Student

## 2024-09-26 LAB — OPHTHALMOLOGY REPORT-SCANNED

## 2024-09-30 ENCOUNTER — Other Ambulatory Visit: Payer: Self-pay

## 2024-09-30 ENCOUNTER — Ambulatory Visit: Payer: Self-pay | Admitting: Student

## 2024-09-30 ENCOUNTER — Other Ambulatory Visit (HOSPITAL_COMMUNITY): Payer: Self-pay

## 2024-09-30 ENCOUNTER — Encounter: Payer: Self-pay | Admitting: Student

## 2024-09-30 VITALS — BP 118/69 | HR 101 | Temp 97.8°F | Ht 60.0 in | Wt 227.2 lb

## 2024-09-30 DIAGNOSIS — E782 Mixed hyperlipidemia: Secondary | ICD-10-CM

## 2024-09-30 DIAGNOSIS — G4489 Other headache syndrome: Secondary | ICD-10-CM

## 2024-09-30 DIAGNOSIS — G4733 Obstructive sleep apnea (adult) (pediatric): Secondary | ICD-10-CM

## 2024-09-30 DIAGNOSIS — E669 Obesity, unspecified: Secondary | ICD-10-CM

## 2024-09-30 DIAGNOSIS — Z6841 Body Mass Index (BMI) 40.0 and over, adult: Secondary | ICD-10-CM

## 2024-09-30 MED ORDER — SEMAGLUTIDE-WEIGHT MANAGEMENT 2.4 MG/0.75ML ~~LOC~~ SOAJ: 2.4000 mg | SUBCUTANEOUS | 3 refills | Status: DC

## 2024-09-30 MED ORDER — SEMAGLUTIDE-WEIGHT MANAGEMENT 1.7 MG/0.75ML ~~LOC~~ SOAJ: 1.7000 mg | SUBCUTANEOUS | 0 refills | Status: DC

## 2024-09-30 MED ORDER — ZEPBOUND 5 MG/0.5ML ~~LOC~~ SOAJ: 5.0000 mg | SUBCUTANEOUS | 0 refills | Status: DC

## 2024-09-30 MED ORDER — SEMAGLUTIDE-WEIGHT MANAGEMENT 0.5 MG/0.5ML ~~LOC~~ SOAJ: 0.5000 mg | SUBCUTANEOUS | 0 refills | Status: DC

## 2024-09-30 MED ORDER — SEMAGLUTIDE-WEIGHT MANAGEMENT 1 MG/0.5ML ~~LOC~~ SOAJ: 1.0000 mg | SUBCUTANEOUS | 0 refills | Status: DC

## 2024-09-30 MED ORDER — ZEPBOUND 2.5 MG/0.5ML ~~LOC~~ SOAJ: 2.5000 mg | SUBCUTANEOUS | 0 refills | Status: DC

## 2024-09-30 MED ORDER — ZEPBOUND 10 MG/0.5ML ~~LOC~~ SOAJ: 10.0000 mg | SUBCUTANEOUS | 0 refills | Status: DC

## 2024-09-30 MED ORDER — ZEPBOUND 15 MG/0.5ML ~~LOC~~ SOAJ: 15.0000 mg | SUBCUTANEOUS | 3 refills | Status: DC

## 2024-09-30 MED ORDER — ZEPBOUND 12.5 MG/0.5ML ~~LOC~~ SOAJ: 12.5000 mg | SUBCUTANEOUS | 0 refills | Status: DC

## 2024-09-30 MED ORDER — ZEPBOUND 7.5 MG/0.5ML ~~LOC~~ SOAJ: 7.5000 mg | SUBCUTANEOUS | 0 refills | Status: DC

## 2024-09-30 MED ORDER — SEMAGLUTIDE-WEIGHT MANAGEMENT 0.25 MG/0.5ML ~~LOC~~ SOAJ: 0.2500 mg | SUBCUTANEOUS | 0 refills | Status: DC

## 2024-09-30 MED ORDER — PROCHLORPERAZINE MALEATE 5 MG PO TABS: 5.0000 mg | ORAL_TABLET | Freq: Three times a day (TID) | ORAL | 0 refills | Status: AC | PRN

## 2024-09-30 NOTE — Patient Instructions (Addendum)
 Start Zepbound  once weekly. Stop taking if you become pregnant or plan to become pregnant.  Take your prescription medications as usual on the day of your doctor visit. Unless specifically instructed, there is no need to fast prior to laboratory blood testing.  Bring all of the medications that you take (including over the counter medications and supplements) with you to every clinic visit.  This after visit summary is an important review of tests, referrals, and medication changes that were discussed during your visit. If you have questions or concerns, call [phone]. Outside of clinic business hours, call the main hospital at [phone] and ask the operator for the on-call internal medicine resident.   Ozell Kung MD 09/30/2024, 3:36 PM

## 2024-09-30 NOTE — Assessment & Plan Note (Addendum)
 Lab Results  Component Value Date   LDLCALC 147 (H) 06/21/2024   Chronic mixed hyperlipidemia with elevated triglycerides and low HDL. Working on lifestyle modification and medication assisted weight loss in this 28 year old patient. Follow lipids closely, may be appropriate to start statin for ASCVD risk reduction if they don't change with weight loss or weight loss unsuccessful.

## 2024-09-30 NOTE — Assessment & Plan Note (Signed)
 This is a weight related condition, she is treated with NIPPV which she is adherent to at least 4 hours per night.  Start Zepbound  for total indication of obesity with weight related complication and OSA. Orders:   tirzepatide  (ZEPBOUND ) 2.5 MG/0.5ML Pen; Inject 2.5 mg into the skin once a week.   tirzepatide  (ZEPBOUND ) 5 MG/0.5ML Pen; Inject 5 mg into the skin once a week.   tirzepatide  (ZEPBOUND ) 7.5 MG/0.5ML Pen; Inject 7.5 mg into the skin once a week.   tirzepatide  (ZEPBOUND ) 10 MG/0.5ML Pen; Inject 10 mg into the skin once a week.   tirzepatide  (ZEPBOUND ) 12.5 MG/0.5ML Pen; Inject 12.5 mg into the skin once a week.   tirzepatide  (ZEPBOUND ) 15 MG/0.5ML Pen; Inject 15 mg into the skin once a week.

## 2024-09-30 NOTE — Progress Notes (Signed)
 " Patient name: Meghan Riggs Date of birth: [birth year] Date of visit: 09/30/2024  Subjective  Reason for visit: Follow-up, Anxiety, and Weight Loss (Pt is wanting to speak to provider about weight loss ( zepbound  ) )  Discussed the use of AI scribe software for clinical note transcription with the patient, who gave verbal consent to proceed.  History of Present Illness   Meghan Riggs is a 28 year old female who presents with concerns of intracranial pressure and symptoms suggestive of multiple sclerosis.  Visual disturbances - Persistent vision problems - History of elevated intraocular pressure  Peripheral neuropathy and paresthesias - Numbness in feet when standing - Severe hand pain similar to carpal tunnel syndrome, persistent since pregnancy - Carbamazepine  prescribed for nerve pain but not started  Headache and migraine symptoms - Migraines with severe, excruciating headaches - Ibuprofen  alone does not relieve pain - Uses Compazine  with ibuprofen , but pain remains debilitating - Headaches limit ability to care for children  Intracranial hypertension - Prior lumbar puncture showed mildly elevated intracranial pressure  Sleep disturbances and autonomic symptoms - Night sweats - Poor sleep quality  Psychiatric and neurodevelopmental symptoms - Takes Prozac and hydroxyzine  for mental health - Diagnosed with ADHD, anxiety, and PTSD - Borderline personality disorder under evaluation  Metabolic and cardiovascular risk factors - Concern about weight, with strong family and lifestyle component - History of gestational diabetes and hypertension during first pregnancy       Show/hide medication list[1]   Objective  Today's Vitals   09/30/24 1450  BP: 118/69  Pulse: (!) 101  Temp: 97.8 F (36.6 C)  TempSrc: Oral  SpO2: 99%  Weight: 227 lb 3.2 oz (103.1 kg)  Height: 5' (1.524 m)  Body mass index is 44.37 kg/m.   Physical Exam Constitutional:       Appearance: Normal appearance.  Pulmonary:     Effort: Pulmonary effort is normal. No respiratory distress.  Skin:    General: Skin is warm and dry.  Neurological:     Mental Status: She is alert.     Cranial Nerves: No facial asymmetry.  Psychiatric:        Mood and Affect: Affect normal.        Speech: Speech normal.        Behavior: Behavior normal.   Results   Diagnostic Lumbar puncture (09/11/2024): Opening pressure at upper limit of normal; cerebrospinal fluid otherwise within normal limits       Assessment & Plan BMI 40.0-44.9, adult (HCC) BMI Readings from Last 3 Encounters:  09/30/24 44.37 kg/m  09/13/24 44.84 kg/m  09/10/24 43.94 kg/m   I recommend eliminating sugary beverages like soda, sweet tea, and juice entirely. I recommended more vegetables, lean protein, and legumes. Frozen vegetables are healthy and inexpensive. Beans are a healthy and inexpensive source of lean protein and fiber. I recommend gradually increasing exercise. A daily walk is a great way to start an exercise program.  Referral: Yes to in-office medical nutrition therapy  Pharmacological intervention: Zepbound  for dual indication of obesity with weight related comorbidity and OSA.  Orders:   Referral to Nutrition and Diabetes Services   tirzepatide  (ZEPBOUND ) 2.5 MG/0.5ML Pen; Inject 2.5 mg into the skin once a week.   tirzepatide  (ZEPBOUND ) 5 MG/0.5ML Pen; Inject 5 mg into the skin once a week.   tirzepatide  (ZEPBOUND ) 7.5 MG/0.5ML Pen; Inject 7.5 mg into the skin once a week.   tirzepatide  (ZEPBOUND ) 10 MG/0.5ML Pen; Inject 10 mg into the  skin once a week.   tirzepatide  (ZEPBOUND ) 12.5 MG/0.5ML Pen; Inject 12.5 mg into the skin once a week.   tirzepatide  (ZEPBOUND ) 15 MG/0.5ML Pen; Inject 15 mg into the skin once a week.  Mixed hyperlipidemia Lab Results  Component Value Date   LDLCALC 147 (H) 06/21/2024   Chronic mixed hyperlipidemia with elevated triglycerides and low HDL. Working on  lifestyle modification and medication assisted weight loss in this 28 year old patient. Follow lipids closely, may be appropriate to start statin for ASCVD risk reduction if they don't change with weight loss or weight loss unsuccessful.      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  However, she is being evaluated for intracranial hypertension, weight loss was recommended for this.  Her CSF opening pressure was on the high end of normal.  MS seems unlikely especially given the negative studies on the CSF sampling.  Refilled Compazine  for ongoing symptomatic support, but this is not a permanent medication.  Orders:   prochlorperazine  (COMPAZINE ) 5 MG tablet; Take 1 tablet (5 mg total) by mouth every 8 (eight) hours as needed (headache).  OSA (obstructive sleep apnea) This is a weight related condition, she is treated with NIPPV which she is adherent to at least 4 hours per night.  Start Zepbound  for total indication of obesity with weight related complication and OSA. Orders:   tirzepatide  (ZEPBOUND ) 2.5 MG/0.5ML Pen; Inject 2.5 mg into the skin once a week.   tirzepatide  (ZEPBOUND ) 5 MG/0.5ML Pen; Inject 5 mg into the skin once a week.   tirzepatide  (ZEPBOUND ) 7.5 MG/0.5ML Pen; Inject 7.5 mg into the skin once a week.   tirzepatide  (ZEPBOUND ) 10 MG/0.5ML Pen; Inject 10 mg into the skin once a week.   tirzepatide  (ZEPBOUND ) 12.5 MG/0.5ML Pen; Inject 12.5 mg into the skin once a week.   tirzepatide  (ZEPBOUND ) 15 MG/0.5ML Pen; Inject 15 mg into the skin once a week.  Return in about 1 month (around 10/31/2024) for chronic condition management, or sooner if needed.  Ozell Kung MD 09/30/2024, 4:35 PM         [1]  Outpatient Medications Prior to Visit  Medication Sig   acetaZOLAMIDE  (DIAMOX ) 125 MG tablet Take 1 tablet (125 mg  total) by mouth 3 (three) times daily. FOLLOW REGIMEN AS NOTED IN CHART   aluminum  chloride (DRYSOL) 20 % external solution Apply topically at bedtime. (Patient not taking: Reported on 09/13/2024)   atomoxetine (STRATTERA) 40 MG capsule Take 40 mg by mouth daily.   budesonide -formoterol  (SYMBICORT ) 80-4.5 MCG/ACT inhaler Inhale 2 puffs into the lungs 2 (two) times daily as needed (For wheezing or Shortness of breath).   carbamazepine  (CARBATROL ) 100 MG 12 hr capsule Take 1 capsule (100 mg total) by mouth 2 (two) times daily. If symptoms do not improve within 3 days, increase to 2 capsules (200mg ) by mouth two times daily   FLUoxetine (PROZAC) 40 MG capsule Take 40 mg by mouth daily.   hydrOXYzine  (ATARAX ) 10 MG tablet Take 10 mg by mouth 3 (three) times daily.   loratadine  (CLARITIN ) 10 MG tablet Take 1 tablet (10 mg total) by mouth daily. (Patient not taking: Reported on 09/13/2024)   nystatin  powder Apply 1 Application topically 3 (three) times daily. (Patient not taking: Reported on 09/13/2024)   omeprazole  (PRILOSEC) 40 MG capsule Take one capsule by mouth each morning and at bedtime. PLEASE SCHEDULE  A FOLLOW UP APPOINTMENT FOR FURTHER REFILLS. Thank you (Patient not taking: Reported on 09/13/2024)   pregabalin  (LYRICA ) 150 MG capsule Take 1 capsule (150 mg total) by mouth in the morning, at noon, and at bedtime. (Patient not taking: Reported on 09/13/2024)   sucralfate  (CARAFATE ) 1 GM/10ML suspension Take 10 mLs (1 g total) by mouth 4 (four) times daily. (Patient not taking: Reported on 09/13/2024)   SUMAtriptan  (IMITREX ) 100 MG tablet Take 1 tablet (100 mg total) by mouth once as needed for up to 1 dose for migraine. May repeat in 2 hours if headache persists or recurs. (Patient not taking: Reported on 09/13/2024)   [DISCONTINUED] prochlorperazine  (COMPAZINE ) 5 MG tablet Take 1 tablet (5 mg total) by mouth every 8 (eight) hours as needed for nausea or vomiting. (Patient not taking: Reported on 09/13/2024)    [DISCONTINUED] tirzepatide  (ZEPBOUND ) 2.5 MG/0.5ML injection vial Inject 2.5 mg into the skin once a week. (Patient not taking: Reported on 09/13/2024)   No facility-administered medications prior to visit.   "

## 2024-09-30 NOTE — Assessment & Plan Note (Addendum)
 BMI Readings from Last 3 Encounters:  09/30/24 44.37 kg/m  09/13/24 44.84 kg/m  09/10/24 43.94 kg/m   I recommend eliminating sugary beverages like soda, sweet tea, and juice entirely. I recommended more vegetables, lean protein, and legumes. Frozen vegetables are healthy and inexpensive. Beans are a healthy and inexpensive source of lean protein and fiber. I recommend gradually increasing exercise. A daily walk is a great way to start an exercise program.  Referral: Yes to in-office medical nutrition therapy  Pharmacological intervention: Zepbound  for dual indication of obesity with weight related comorbidity and OSA.  Orders:   Referral to Nutrition and Diabetes Services   tirzepatide  (ZEPBOUND ) 2.5 MG/0.5ML Pen; Inject 2.5 mg into the skin once a week.   tirzepatide  (ZEPBOUND ) 5 MG/0.5ML Pen; Inject 5 mg into the skin once a week.   tirzepatide  (ZEPBOUND ) 7.5 MG/0.5ML Pen; Inject 7.5 mg into the skin once a week.   tirzepatide  (ZEPBOUND ) 10 MG/0.5ML Pen; Inject 10 mg into the skin once a week.   tirzepatide  (ZEPBOUND ) 12.5 MG/0.5ML Pen; Inject 12.5 mg into the skin once a week.   tirzepatide  (ZEPBOUND ) 15 MG/0.5ML Pen; Inject 15 mg into the skin once a week.

## 2024-10-01 ENCOUNTER — Telehealth: Payer: Self-pay

## 2024-10-01 NOTE — Telephone Encounter (Signed)
 Prior Authorization for patient (Wegovy  0.25MG /0.5ML auto-injectors and Zepbound  2.5MG /0.5ML pen-injectors) was submitted on cover my meds.SABRA

## 2024-10-01 NOTE — Telephone Encounter (Signed)
 This is a courtesy notification to advise you that we do not cover the above-requested medication  under this members benefit plan.   I called and spoke to the patient. Patient is aware that medicaid will not cover any GLP-1. I offered the patient a referral to healthy weight and wellness or the option of paying OOP. Patient declined the referral and stated that she would try to work on losing weight on her own. We can try to resubmit the PA after her visit in January as insurance plans changes before and after the new year. Patient agreed.

## 2024-10-02 NOTE — Progress Notes (Signed)
 Internal Medicine Clinic Attending  Case discussed with the resident at the time of the visit.  We reviewed the resident's history and exam and pertinent patient test results.  I agree with the assessment, diagnosis, and plan of care documented in the resident's note.

## 2024-10-31 ENCOUNTER — Telehealth: Payer: Self-pay | Admitting: Student

## 2024-10-31 DIAGNOSIS — Z87891 Personal history of nicotine dependence: Secondary | ICD-10-CM | POA: Diagnosis not present

## 2024-10-31 DIAGNOSIS — Z6841 Body Mass Index (BMI) 40.0 and over, adult: Secondary | ICD-10-CM | POA: Diagnosis not present

## 2024-10-31 DIAGNOSIS — G4733 Obstructive sleep apnea (adult) (pediatric): Secondary | ICD-10-CM | POA: Diagnosis not present

## 2024-10-31 NOTE — Progress Notes (Unsigned)
" °  [facility] Telephone Encounter Continuity Care Appointment  HPI:  This telephone encounter was created for [name] on 11/01/2024 for the following purpose/cc to discuss obesity.  Please note that a video call was attempted however due to technical difficulties, we transition to phone call.   Past Medical History:  Past Medical History:  Diagnosis Date   Acute otitis media 12/03/2021   Anemia    Anxiety    Arthritis    Asthma    Dr. 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   Depression    DM (diabetes mellitus) (HCC)    Gastritis    GERD (gastroesophageal reflux disease)    HTN (hypertension)    Hyperlipidemia    Internal hemorrhoids    Osteoarthritis    Viral URI with cough 11/22/2021     Assessment / Plan / Recommendations:  Please see A&P under problem oriented charting for assessment of the patient's acute and chronic medical conditions.   Patient presents with a BMI of 44.37.  Patient has tried to eat healthier and cut out sugary drinks and also exercises for 30 minutes per day. Patient is committed to weight loss by continuing diet and exercising. Patient denies family and personal history of medullary thyroid cancer and also denies family and personal history of MEN type II. Patient is currently not pregnant nor lactating.  Patient has a medical history significant for OSA in which she uses CPAP nightly for.  Patient would greatly benefit from a GLP-1 to assist with her lifestyle changes and weight loss for her morbid obesity. Plan: -Continue current diet and 30 minutes of exercise per day along with using CPAP for OSA -Start tirzepatide  2.5 mg weekly -If we are unable to have medications approved, will refer to healthy weight and wellness clinic in town     Consent and Medical Decision Making:  Patient discussed with Dr. CHARLENA Eastern This is a telephone encounter between [facility] and Damien Lease on  11/01/2024 for Obesity. The visit was conducted with the patient located at home and Damien Lease at [facility]. The patient's identity was confirmed using their DOB and current address. The patient has consented to being evaluated through a telephone encounter and understands the associated risks (an examination cannot be done and the patient may need to come in for an appointment) / benefits (allows the patient to remain at home, decreasing exposure to coronavirus). I personally spent 10 minutes on medical discussion.      "

## 2024-11-01 ENCOUNTER — Telehealth: Payer: Self-pay

## 2024-11-01 DIAGNOSIS — G4733 Obstructive sleep apnea (adult) (pediatric): Secondary | ICD-10-CM

## 2024-11-01 MED ORDER — ZEPBOUND 2.5 MG/0.5ML ~~LOC~~ SOAJ: 2.5000 mg | SUBCUTANEOUS | 0 refills | Status: AC

## 2024-11-01 NOTE — Assessment & Plan Note (Signed)
 Patient presents with a BMI of 44.37.  Patient has tried to eat healthier and cut out sugary drinks and also exercises for 30 minutes per day. Patient is committed to weight loss by continuing diet and exercising. Patient denies family and personal history of medullary thyroid cancer and also denies family and personal history of MEN type II. Patient is currently not pregnant nor lactating.  Patient has a medical history significant for OSA in which she uses CPAP nightly for.  Patient would greatly benefit from a GLP-1 to assist with her lifestyle changes and weight loss for her morbid obesity. Plan: -Continue current diet and 30 minutes of exercise per day along with using CPAP for OSA -Start tirzepatide  2.5 mg weekly -If we are unable to have medications approved, will refer to healthy weight and wellness clinic in town

## 2024-11-01 NOTE — Telephone Encounter (Signed)
 Prior Authorization for patient (Zepbound  2.5MG /0.5ML pen-injectors) came through on cover my meds was submitted with last office notes and labs awaiting approval or denial.  XZB:[id number]

## 2024-11-05 NOTE — Telephone Encounter (Signed)
 Regarding member: [id number]; Meghan Riggs; [birth year] Dear Damien Lease: [employer] reviewed your ZEPBOUND  2.5 MG/0.5 ML PEN request for the above-identified member, and it  is denied for the following reason: because we did not see what we need to approve the drug you asked  for, (Zepbound ). We may be able to approve this drug when we see certain records (documentation  that the diagnosis of sleep apnea was confirmed by sleep apnea testing). If we receive these records,  we may need more information (if you have been taught ways to improve your sleep habits). We based  this decision on your health plans prior authorization clinical criteria named GLP1s (glucagon-like  peptide 1s) for Weight Management.  Can we try to get this approved?

## 2024-11-12 ENCOUNTER — Telehealth: Payer: Self-pay

## 2024-11-12 DIAGNOSIS — G500 Trigeminal neuralgia: Secondary | ICD-10-CM

## 2024-11-12 MED ORDER — ACETAZOLAMIDE 125 MG PO TABS: 125.0000 mg | ORAL_TABLET | Freq: Three times a day (TID) | ORAL | 0 refills | Status: AC

## 2024-11-12 NOTE — Telephone Encounter (Signed)
 [facility] was able to find the patients sleep study that we received from the patient.  Sleep study is not scanned into the patients chart due to being ineligible per medical records department.

## 2024-11-12 NOTE — Telephone Encounter (Signed)
 I didn't see one in her chart.

## 2024-11-12 NOTE — Progress Notes (Signed)
 Internal Medicine Clinic Attending  Case discussed with the resident at the time of the visit.  We reviewed the resident's history and exam and pertinent patient test results.  I agree with the assessment, diagnosis, and plan of care documented in the resident's note.

## 2024-11-12 NOTE — Addendum Note (Signed)
 Addended by: KEM NA on: 11/12/2024 02:50 PM   Modules accepted: Orders

## 2024-11-12 NOTE — Telephone Encounter (Signed)
 Medication refilled with co-sign from [place] NP

## 2024-11-15 ENCOUNTER — Ambulatory Visit: Admitting: Neurology

## 2025-01-13 ENCOUNTER — Ambulatory Visit: Payer: Self-pay | Admitting: Neurology

## 2025-01-22 ENCOUNTER — Ambulatory Visit: Admitting: Physician Assistant

## 2025-02-25 ENCOUNTER — Ambulatory Visit: Admitting: Neurology

## 2025-03-26 ENCOUNTER — Ambulatory Visit: Admitting: Adult Health
